# Patient Record
Sex: Female | Born: 1975 | Race: White | Hispanic: No | Marital: Married | Smoking: Current every day smoker
Health system: Southern US, Community
[De-identification: ages and names within clinical notes are randomized; demographics above are authoritative.]

## PROBLEM LIST (undated history)

## (undated) DIAGNOSIS — T1490XA Injury, unspecified, initial encounter: Secondary | ICD-10-CM

## (undated) DIAGNOSIS — M797 Fibromyalgia: Secondary | ICD-10-CM

## (undated) DIAGNOSIS — K219 Gastro-esophageal reflux disease without esophagitis: Secondary | ICD-10-CM

## (undated) DIAGNOSIS — Z8719 Personal history of other diseases of the digestive system: Secondary | ICD-10-CM

## (undated) DIAGNOSIS — R519 Headache, unspecified: Secondary | ICD-10-CM

## (undated) DIAGNOSIS — R87629 Unspecified abnormal cytological findings in specimens from vagina: Secondary | ICD-10-CM

## (undated) DIAGNOSIS — M1990 Unspecified osteoarthritis, unspecified site: Secondary | ICD-10-CM

## (undated) DIAGNOSIS — J42 Unspecified chronic bronchitis: Secondary | ICD-10-CM

## (undated) HISTORY — PX: KNEE SURGERY: SHX244

## (undated) HISTORY — DX: Injury, unspecified, initial encounter: T14.90XA

## (undated) HISTORY — DX: Unspecified osteoarthritis, unspecified site: M19.90

## (undated) HISTORY — PX: HIP SURGERY: SHX245

## (undated) HISTORY — PX: TONSILLECTOMY: SUR1361

## (undated) HISTORY — DX: Unspecified abnormal cytological findings in specimens from vagina: R87.629

---

## 2010-08-17 HISTORY — PX: CHOLECYSTECTOMY: SHX55

## 2015-08-18 HISTORY — PX: FRACTURE SURGERY: SHX138

## 2018-07-22 DIAGNOSIS — E119 Type 2 diabetes mellitus without complications: Secondary | ICD-10-CM | POA: Diagnosis not present

## 2018-07-22 DIAGNOSIS — E559 Vitamin D deficiency, unspecified: Secondary | ICD-10-CM | POA: Diagnosis not present

## 2018-07-22 DIAGNOSIS — I10 Essential (primary) hypertension: Secondary | ICD-10-CM | POA: Diagnosis not present

## 2018-07-22 DIAGNOSIS — E038 Other specified hypothyroidism: Secondary | ICD-10-CM | POA: Diagnosis not present

## 2018-07-22 DIAGNOSIS — Z5181 Encounter for therapeutic drug level monitoring: Secondary | ICD-10-CM | POA: Diagnosis not present

## 2018-07-22 DIAGNOSIS — M792 Neuralgia and neuritis, unspecified: Secondary | ICD-10-CM | POA: Diagnosis not present

## 2018-07-22 DIAGNOSIS — D518 Other vitamin B12 deficiency anemias: Secondary | ICD-10-CM | POA: Diagnosis not present

## 2018-07-22 DIAGNOSIS — E782 Mixed hyperlipidemia: Secondary | ICD-10-CM | POA: Diagnosis not present

## 2018-07-22 DIAGNOSIS — M797 Fibromyalgia: Secondary | ICD-10-CM | POA: Diagnosis not present

## 2018-07-22 DIAGNOSIS — K219 Gastro-esophageal reflux disease without esophagitis: Secondary | ICD-10-CM | POA: Diagnosis not present

## 2018-07-22 DIAGNOSIS — M150 Primary generalized (osteo)arthritis: Secondary | ICD-10-CM | POA: Diagnosis not present

## 2018-07-29 DIAGNOSIS — S8391XA Sprain of unspecified site of right knee, initial encounter: Secondary | ICD-10-CM | POA: Diagnosis not present

## 2018-07-29 DIAGNOSIS — M25561 Pain in right knee: Secondary | ICD-10-CM | POA: Diagnosis not present

## 2018-07-29 DIAGNOSIS — H6693 Otitis media, unspecified, bilateral: Secondary | ICD-10-CM | POA: Diagnosis not present

## 2018-08-18 ENCOUNTER — Other Ambulatory Visit: Payer: Self-pay | Admitting: Internal Medicine

## 2018-08-18 DIAGNOSIS — Z1231 Encounter for screening mammogram for malignant neoplasm of breast: Secondary | ICD-10-CM

## 2018-08-22 DIAGNOSIS — M797 Fibromyalgia: Secondary | ICD-10-CM | POA: Diagnosis not present

## 2018-08-22 DIAGNOSIS — Z79899 Other long term (current) drug therapy: Secondary | ICD-10-CM | POA: Diagnosis not present

## 2018-08-22 DIAGNOSIS — M792 Neuralgia and neuritis, unspecified: Secondary | ICD-10-CM | POA: Diagnosis not present

## 2018-08-22 DIAGNOSIS — M150 Primary generalized (osteo)arthritis: Secondary | ICD-10-CM | POA: Diagnosis not present

## 2018-08-22 DIAGNOSIS — F418 Other specified anxiety disorders: Secondary | ICD-10-CM | POA: Diagnosis not present

## 2018-08-22 DIAGNOSIS — G4700 Insomnia, unspecified: Secondary | ICD-10-CM | POA: Diagnosis not present

## 2018-08-22 DIAGNOSIS — Z5181 Encounter for therapeutic drug level monitoring: Secondary | ICD-10-CM | POA: Diagnosis not present

## 2018-08-22 DIAGNOSIS — F331 Major depressive disorder, recurrent, moderate: Secondary | ICD-10-CM | POA: Diagnosis not present

## 2018-08-22 DIAGNOSIS — K219 Gastro-esophageal reflux disease without esophagitis: Secondary | ICD-10-CM | POA: Diagnosis not present

## 2018-08-22 DIAGNOSIS — R252 Cramp and spasm: Secondary | ICD-10-CM | POA: Diagnosis not present

## 2018-09-16 ENCOUNTER — Other Ambulatory Visit: Payer: Self-pay

## 2018-09-16 ENCOUNTER — Telehealth: Payer: Self-pay | Admitting: General Practice

## 2018-09-16 ENCOUNTER — Ambulatory Visit: Payer: 59 | Admitting: Obstetrics and Gynecology

## 2018-09-16 ENCOUNTER — Ambulatory Visit
Admission: RE | Admit: 2018-09-16 | Discharge: 2018-09-16 | Disposition: A | Discharge status: Home / Self Care | Payer: PRIVATE HEALTH INSURANCE | Source: Ambulatory Visit | Attending: Internal Medicine | Admitting: Internal Medicine

## 2018-09-16 ENCOUNTER — Encounter: Payer: Self-pay | Admitting: Obstetrics and Gynecology

## 2018-09-16 VITALS — BP 143/83 | HR 76 | Temp 97.6°F | Ht 66.0 in | Wt 367.2 lb

## 2018-09-16 DIAGNOSIS — R300 Dysuria: Secondary | ICD-10-CM | POA: Diagnosis not present

## 2018-09-16 DIAGNOSIS — Z124 Encounter for screening for malignant neoplasm of cervix: Secondary | ICD-10-CM | POA: Diagnosis not present

## 2018-09-16 DIAGNOSIS — Z1151 Encounter for screening for human papillomavirus (HPV): Secondary | ICD-10-CM | POA: Diagnosis not present

## 2018-09-16 DIAGNOSIS — T8332XA Displacement of intrauterine contraceptive device, initial encounter: Secondary | ICD-10-CM

## 2018-09-16 DIAGNOSIS — Z1231 Encounter for screening mammogram for malignant neoplasm of breast: Secondary | ICD-10-CM | POA: Diagnosis not present

## 2018-09-16 DIAGNOSIS — Z01419 Encounter for gynecological examination (general) (routine) without abnormal findings: Secondary | ICD-10-CM | POA: Diagnosis not present

## 2018-09-16 LAB — POCT URINALYSIS DIPSTICK (MANUAL)
Leukocytes, UA: NEGATIVE
Nitrite, UA: NEGATIVE
Poct Bilirubin: NEGATIVE
Poct Blood: NEGATIVE
Poct Glucose: NORMAL mg/dL
Poct Ketones: NEGATIVE
Poct Protein: NEGATIVE mg/dL
Poct Urobilinogen: NORMAL mg/dL
Spec Grav, UA: 1.01 (ref 1.010–1.025)
pH, UA: 7 (ref 5.0–8.0)

## 2018-09-16 NOTE — Telephone Encounter (Signed)
Patient notified of [place] appt for 09/22/18 at 8:00am @ [facility]

## 2018-09-18 LAB — URINE CULTURE

## 2018-09-19 ENCOUNTER — Encounter: Payer: Self-pay | Admitting: Obstetrics and Gynecology
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
## 2018-09-21 ENCOUNTER — Ambulatory Visit (HOSPITAL_COMMUNITY): Payer: 59

## 2018-09-21 ENCOUNTER — Encounter (HOSPITAL_COMMUNITY): Payer: Self-pay

## 2018-09-21 DIAGNOSIS — F418 Other specified anxiety disorders: Secondary | ICD-10-CM | POA: Diagnosis not present

## 2018-09-21 DIAGNOSIS — K219 Gastro-esophageal reflux disease without esophagitis: Secondary | ICD-10-CM | POA: Diagnosis not present

## 2018-09-21 DIAGNOSIS — M797 Fibromyalgia: Secondary | ICD-10-CM | POA: Diagnosis not present

## 2018-09-21 DIAGNOSIS — Z5181 Encounter for therapeutic drug level monitoring: Secondary | ICD-10-CM | POA: Diagnosis not present

## 2018-09-21 DIAGNOSIS — M792 Neuralgia and neuritis, unspecified: Secondary | ICD-10-CM | POA: Diagnosis not present

## 2018-09-21 DIAGNOSIS — M150 Primary generalized (osteo)arthritis: Secondary | ICD-10-CM | POA: Diagnosis not present

## 2018-09-21 DIAGNOSIS — R5383 Other fatigue: Secondary | ICD-10-CM | POA: Diagnosis not present

## 2018-09-21 DIAGNOSIS — F331 Major depressive disorder, recurrent, moderate: Secondary | ICD-10-CM | POA: Diagnosis not present

## 2018-09-21 DIAGNOSIS — R079 Chest pain, unspecified: Secondary | ICD-10-CM | POA: Diagnosis not present

## 2018-09-21 LAB — CYTOLOGY - PAP
Diagnosis: NEGATIVE
HPV: NOT DETECTED

## 2018-09-22 ENCOUNTER — Ambulatory Visit (HOSPITAL_COMMUNITY): Payer: PRIVATE HEALTH INSURANCE

## 2018-09-29 ENCOUNTER — Ambulatory Visit (HOSPITAL_COMMUNITY)
Admission: RE | Admit: 2018-09-29 | Discharge: 2018-09-29 | Disposition: A | Discharge status: Home / Self Care | Payer: 59 | Source: Ambulatory Visit | Attending: Obstetrics and Gynecology | Admitting: Obstetrics and Gynecology

## 2018-09-29 ENCOUNTER — Encounter (INDEPENDENT_AMBULATORY_CARE_PROVIDER_SITE_OTHER): Payer: Self-pay

## 2018-09-29 DIAGNOSIS — T8332XA Displacement of intrauterine contraceptive device, initial encounter: Secondary | ICD-10-CM | POA: Diagnosis not present

## 2018-09-30 ENCOUNTER — Other Ambulatory Visit: Payer: Self-pay | Admitting: Obstetrics and Gynecology

## 2018-09-30 DIAGNOSIS — T8332XA Displacement of intrauterine contraceptive device, initial encounter: Secondary | ICD-10-CM

## 2018-09-30 NOTE — Progress Notes (Signed)
Transvaginal Pelvis U/S ordered

## 2018-10-03 ENCOUNTER — Telehealth: Payer: Self-pay | Admitting: General Practice

## 2018-10-03 NOTE — Telephone Encounter (Signed)
Left message for patient to give our office a call back in regards to U/S appointment scheduled for 10/12/18 at 11:00am.

## 2018-10-10 DIAGNOSIS — Z5181 Encounter for therapeutic drug level monitoring: Secondary | ICD-10-CM | POA: Diagnosis not present

## 2018-10-10 DIAGNOSIS — F331 Major depressive disorder, recurrent, moderate: Secondary | ICD-10-CM | POA: Diagnosis not present

## 2018-10-10 DIAGNOSIS — M792 Neuralgia and neuritis, unspecified: Secondary | ICD-10-CM | POA: Diagnosis not present

## 2018-10-10 DIAGNOSIS — F418 Other specified anxiety disorders: Secondary | ICD-10-CM | POA: Diagnosis not present

## 2018-10-10 DIAGNOSIS — G4700 Insomnia, unspecified: Secondary | ICD-10-CM | POA: Diagnosis not present

## 2018-10-10 DIAGNOSIS — K219 Gastro-esophageal reflux disease without esophagitis: Secondary | ICD-10-CM | POA: Diagnosis not present

## 2018-10-10 DIAGNOSIS — M797 Fibromyalgia: Secondary | ICD-10-CM | POA: Diagnosis not present

## 2018-10-10 DIAGNOSIS — M150 Primary generalized (osteo)arthritis: Secondary | ICD-10-CM | POA: Diagnosis not present

## 2018-10-10 DIAGNOSIS — Z79899 Other long term (current) drug therapy: Secondary | ICD-10-CM | POA: Diagnosis not present

## 2018-10-11 ENCOUNTER — Ambulatory Visit (INDEPENDENT_AMBULATORY_CARE_PROVIDER_SITE_OTHER): Payer: 59 | Admitting: Family Medicine

## 2018-10-11 ENCOUNTER — Encounter (INDEPENDENT_AMBULATORY_CARE_PROVIDER_SITE_OTHER): Payer: Self-pay

## 2018-10-12 ENCOUNTER — Ambulatory Visit (HOSPITAL_COMMUNITY): Payer: 59

## 2018-10-13 ENCOUNTER — Ambulatory Visit (HOSPITAL_COMMUNITY): Admission: RE | Admit: 2018-10-13 | Payer: 59 | Source: Ambulatory Visit

## 2018-10-25 ENCOUNTER — Ambulatory Visit (INDEPENDENT_AMBULATORY_CARE_PROVIDER_SITE_OTHER): Payer: Self-pay | Admitting: Family Medicine

## 2018-10-31 DIAGNOSIS — E669 Obesity, unspecified: Secondary | ICD-10-CM | POA: Diagnosis not present

## 2018-11-07 DIAGNOSIS — R6883 Chills (without fever): Secondary | ICD-10-CM | POA: Diagnosis not present

## 2018-11-07 DIAGNOSIS — R05 Cough: Secondary | ICD-10-CM | POA: Diagnosis not present

## 2018-11-07 DIAGNOSIS — J069 Acute upper respiratory infection, unspecified: Secondary | ICD-10-CM | POA: Diagnosis not present

## 2018-11-10 ENCOUNTER — Encounter (INDEPENDENT_AMBULATORY_CARE_PROVIDER_SITE_OTHER): Payer: Self-pay

## 2018-11-14 DIAGNOSIS — E669 Obesity, unspecified: Secondary | ICD-10-CM | POA: Diagnosis not present

## 2018-11-16 DIAGNOSIS — S8391XA Sprain of unspecified site of right knee, initial encounter: Secondary | ICD-10-CM | POA: Diagnosis not present

## 2018-11-16 DIAGNOSIS — Z5181 Encounter for therapeutic drug level monitoring: Secondary | ICD-10-CM | POA: Diagnosis not present

## 2018-11-16 DIAGNOSIS — F418 Other specified anxiety disorders: Secondary | ICD-10-CM | POA: Diagnosis not present

## 2018-12-22 DIAGNOSIS — K219 Gastro-esophageal reflux disease without esophagitis: Secondary | ICD-10-CM | POA: Diagnosis not present

## 2018-12-22 DIAGNOSIS — F418 Other specified anxiety disorders: Secondary | ICD-10-CM | POA: Diagnosis not present

## 2018-12-22 DIAGNOSIS — Z5181 Encounter for therapeutic drug level monitoring: Secondary | ICD-10-CM | POA: Diagnosis not present

## 2018-12-22 DIAGNOSIS — M150 Primary generalized (osteo)arthritis: Secondary | ICD-10-CM | POA: Diagnosis not present

## 2018-12-22 DIAGNOSIS — Z79899 Other long term (current) drug therapy: Secondary | ICD-10-CM | POA: Diagnosis not present

## 2018-12-22 DIAGNOSIS — M797 Fibromyalgia: Secondary | ICD-10-CM | POA: Diagnosis not present

## 2018-12-22 DIAGNOSIS — F331 Major depressive disorder, recurrent, moderate: Secondary | ICD-10-CM | POA: Diagnosis not present

## 2018-12-22 DIAGNOSIS — M1711 Unilateral primary osteoarthritis, right knee: Secondary | ICD-10-CM | POA: Diagnosis not present

## 2018-12-22 DIAGNOSIS — M792 Neuralgia and neuritis, unspecified: Secondary | ICD-10-CM | POA: Diagnosis not present

## 2018-12-27 DIAGNOSIS — B373 Candidiasis of vulva and vagina: Secondary | ICD-10-CM | POA: Diagnosis not present

## 2019-01-10 DIAGNOSIS — Z79899 Other long term (current) drug therapy: Secondary | ICD-10-CM | POA: Diagnosis not present

## 2019-01-20 DIAGNOSIS — F331 Major depressive disorder, recurrent, moderate: Secondary | ICD-10-CM | POA: Diagnosis not present

## 2019-01-20 DIAGNOSIS — G4700 Insomnia, unspecified: Secondary | ICD-10-CM | POA: Diagnosis not present

## 2019-01-20 DIAGNOSIS — Z79899 Other long term (current) drug therapy: Secondary | ICD-10-CM | POA: Diagnosis not present

## 2019-01-20 DIAGNOSIS — K219 Gastro-esophageal reflux disease without esophagitis: Secondary | ICD-10-CM | POA: Diagnosis not present

## 2019-01-20 DIAGNOSIS — M792 Neuralgia and neuritis, unspecified: Secondary | ICD-10-CM | POA: Diagnosis not present

## 2019-01-20 DIAGNOSIS — M150 Primary generalized (osteo)arthritis: Secondary | ICD-10-CM | POA: Diagnosis not present

## 2019-01-20 DIAGNOSIS — M797 Fibromyalgia: Secondary | ICD-10-CM | POA: Diagnosis not present

## 2019-01-20 DIAGNOSIS — F418 Other specified anxiety disorders: Secondary | ICD-10-CM | POA: Diagnosis not present

## 2019-01-20 DIAGNOSIS — Z5181 Encounter for therapeutic drug level monitoring: Secondary | ICD-10-CM | POA: Diagnosis not present

## 2019-02-16 DIAGNOSIS — G43909 Migraine, unspecified, not intractable, without status migrainosus: Secondary | ICD-10-CM | POA: Diagnosis not present

## 2019-02-16 DIAGNOSIS — S8391XA Sprain of unspecified site of right knee, initial encounter: Secondary | ICD-10-CM | POA: Diagnosis not present

## 2019-02-16 DIAGNOSIS — R252 Cramp and spasm: Secondary | ICD-10-CM | POA: Diagnosis not present

## 2019-02-16 DIAGNOSIS — F418 Other specified anxiety disorders: Secondary | ICD-10-CM | POA: Diagnosis not present

## 2019-02-16 DIAGNOSIS — M797 Fibromyalgia: Secondary | ICD-10-CM | POA: Diagnosis not present

## 2019-02-16 DIAGNOSIS — Z5181 Encounter for therapeutic drug level monitoring: Secondary | ICD-10-CM | POA: Diagnosis not present

## 2019-02-16 DIAGNOSIS — Z79899 Other long term (current) drug therapy: Secondary | ICD-10-CM | POA: Diagnosis not present

## 2019-03-09 DIAGNOSIS — E669 Obesity, unspecified: Secondary | ICD-10-CM | POA: Diagnosis not present

## 2019-03-16 DIAGNOSIS — R110 Nausea: Secondary | ICD-10-CM | POA: Diagnosis not present

## 2019-03-16 DIAGNOSIS — R252 Cramp and spasm: Secondary | ICD-10-CM | POA: Diagnosis not present

## 2019-03-16 DIAGNOSIS — F418 Other specified anxiety disorders: Secondary | ICD-10-CM | POA: Diagnosis not present

## 2019-03-16 DIAGNOSIS — Z5181 Encounter for therapeutic drug level monitoring: Secondary | ICD-10-CM | POA: Diagnosis not present

## 2019-03-16 DIAGNOSIS — Z30014 Encounter for initial prescription of intrauterine contraceptive device: Secondary | ICD-10-CM | POA: Diagnosis not present

## 2019-03-16 DIAGNOSIS — H6691 Otitis media, unspecified, right ear: Secondary | ICD-10-CM | POA: Diagnosis not present

## 2019-03-16 DIAGNOSIS — S8391XA Sprain of unspecified site of right knee, initial encounter: Secondary | ICD-10-CM | POA: Diagnosis not present

## 2019-03-21 DIAGNOSIS — E669 Obesity, unspecified: Secondary | ICD-10-CM | POA: Diagnosis not present

## 2019-04-05 DIAGNOSIS — E669 Obesity, unspecified: Secondary | ICD-10-CM | POA: Diagnosis not present

## 2019-04-12 DIAGNOSIS — R350 Frequency of micturition: Secondary | ICD-10-CM | POA: Diagnosis not present

## 2019-04-12 DIAGNOSIS — N390 Urinary tract infection, site not specified: Secondary | ICD-10-CM | POA: Diagnosis not present

## 2019-04-25 ENCOUNTER — Ambulatory Visit: Payer: 59 | Admitting: Orthopedic Surgery

## 2019-05-08 ENCOUNTER — Ambulatory Visit (INDEPENDENT_AMBULATORY_CARE_PROVIDER_SITE_OTHER): Payer: 59

## 2019-05-08 ENCOUNTER — Ambulatory Visit (INDEPENDENT_AMBULATORY_CARE_PROVIDER_SITE_OTHER): Payer: 59 | Admitting: Orthopedic Surgery

## 2019-05-08 ENCOUNTER — Encounter: Payer: Self-pay | Admitting: Orthopedic Surgery

## 2019-05-08 VITALS — Ht 66.0 in | Wt 367.0 lb

## 2019-05-08 DIAGNOSIS — G8929 Other chronic pain: Secondary | ICD-10-CM

## 2019-05-08 DIAGNOSIS — M25561 Pain in right knee: Secondary | ICD-10-CM | POA: Diagnosis not present

## 2019-05-08 DIAGNOSIS — Z6841 Body Mass Index (BMI) 40.0 and over, adult: Secondary | ICD-10-CM

## 2019-05-08 DIAGNOSIS — M1711 Unilateral primary osteoarthritis, right knee: Secondary | ICD-10-CM
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
## 2019-05-12 ENCOUNTER — Other Ambulatory Visit: Payer: Self-pay

## 2019-05-12 DIAGNOSIS — M150 Primary generalized (osteo)arthritis: Secondary | ICD-10-CM | POA: Diagnosis not present

## 2019-05-12 DIAGNOSIS — G4700 Insomnia, unspecified: Secondary | ICD-10-CM | POA: Diagnosis not present

## 2019-05-12 DIAGNOSIS — F418 Other specified anxiety disorders: Secondary | ICD-10-CM | POA: Diagnosis not present

## 2019-05-12 DIAGNOSIS — Z79899 Other long term (current) drug therapy: Secondary | ICD-10-CM | POA: Diagnosis not present

## 2019-05-12 DIAGNOSIS — Z5181 Encounter for therapeutic drug level monitoring: Secondary | ICD-10-CM | POA: Diagnosis not present

## 2019-05-12 DIAGNOSIS — K219 Gastro-esophageal reflux disease without esophagitis: Secondary | ICD-10-CM | POA: Diagnosis not present

## 2019-05-12 DIAGNOSIS — M792 Neuralgia and neuritis, unspecified: Secondary | ICD-10-CM | POA: Diagnosis not present

## 2019-05-12 DIAGNOSIS — F331 Major depressive disorder, recurrent, moderate: Secondary | ICD-10-CM | POA: Diagnosis not present

## 2019-05-12 DIAGNOSIS — M797 Fibromyalgia: Secondary | ICD-10-CM | POA: Diagnosis not present

## 2019-06-02 ENCOUNTER — Other Ambulatory Visit: Payer: Self-pay | Admitting: Family

## 2019-06-05 ENCOUNTER — Inpatient Hospital Stay (HOSPITAL_COMMUNITY)
Admission: RE | Admit: 2019-06-05 | Discharge: 2019-06-05 | Disposition: A | Discharge status: Home / Self Care | Payer: 59 | Source: Ambulatory Visit

## 2019-06-05 ENCOUNTER — Inpatient Hospital Stay (HOSPITAL_COMMUNITY): Admission: RE | Admit: 2019-06-05 | Payer: 59 | Source: Ambulatory Visit

## 2019-06-05 NOTE — Progress Notes (Signed)
Pt no show for PAT appt, called and pt stated she cancelled her surgery.  Instructed her to call surgeon's office, stated she already had cancelled with the office

## 2019-06-05 NOTE — Progress Notes (Signed)
[place], [place] - [address] [facility]. [address] [place] [address] Phone: [phone] Fax: [phone]      Your procedure is scheduled on June 07, 2019.  Report to [facility] Main Entrance "A" at 06:30 A.M., and check in at the Admitting office.  Call this number if you have problems the morning of surgery:  [phone]  Call [phone] if you have any questions prior to your surgery date Monday-Friday 8am-4pm    Remember:  Do not eat after midnight the night before your surgery  You may drink clear liquids until 05:30 the morning of your surgery.   Clear liquids allowed are: Water, Non-Citrus Juices (without pulp), Carbonated Beverages, Clear Tea, Black Coffee Only, and Gatorade   Enhanced Recovery after Surgery for Orthopedics Enhanced Recovery after Surgery is a protocol used to improve the stress on your body and your recovery after surgery.  Patient Instructions  . The night before surgery:  o No food after midnight. ONLY clear liquids after midnight  .  Marland Kitchen The day of surgery (if you do NOT have diabetes):  o Drink ONE (1) Pre-Surgery Clear Ensure as directed.   o This drink was given to you during your hospital  pre-op appointment visit. o The pre-op nurse will instruct you on the time to drink the  Pre-Surgery Ensure depending on your surgery time. Finish drink by 5:30AM o Finish the drink at the designated time by the pre-op nurse.  o Nothing else to drink after completing the  Pre-Surgery Clear Ensure.     Take these medicines the morning of surgery with A SIP OF WATER : None  7 days prior to surgery STOP taking any Aspirin (unless otherwise instructed by your surgeon), Aleve, Naproxen, Ibuprofen, Motrin, Advil, Goody's, BC's, all herbal medications, fish oil, and all vitamins.    The Morning of Surgery  Do not wear jewelry, make-up or nail polish.  Do not wear lotions, powders, or  perfumes/colognes, or deodorant  Do not shave 48 hours prior to surgery.   Do not bring valuables to the hospital.  [facility] is not responsible for any belongings or valuables.  If you are a smoker, DO NOT Smoke 24 hours prior to surgery IF you wear a CPAP at night please bring your mask, tubing, and machine the morning of surgery   Remember that you must have someone to transport you home after your surgery, and remain with you for 24 hours if you are discharged the same day.   Contacts, glasses, hearing aids, dentures or bridgework may not be worn into surgery.    Leave your suitcase in the car.  After surgery it may be brought to your room.  For patients admitted to the hospital, discharge time will be determined by your treatment team.  Patients discharged the day of surgery will not be allowed to drive home.    Special instructions:   [facility]- Preparing For Surgery  Before surgery, you can play an important role. Because skin is not sterile, your skin needs to be as free of germs as possible. You can reduce the number of germs on your skin by washing with CHG (chlorahexidine gluconate) Soap before surgery.  CHG is an antiseptic cleaner which kills germs and bonds with the skin to continue killing germs even after washing.    Oral Hygiene is also important to reduce your risk of infection.  Remember - BRUSH YOUR TEETH THE MORNING OF  SURGERY WITH YOUR REGULAR TOOTHPASTE  Please do not use if you have an allergy to CHG or antibacterial soaps. If your skin becomes reddened/irritated stop using the CHG.  Do not shave (including legs and underarms) for at least 48 hours prior to first CHG shower. It is OK to shave your face.  Please follow these instructions carefully.   1. Shower the NIGHT BEFORE SURGERY and the MORNING OF SURGERY with CHG Soap.   2. If you chose to wash your hair, wash your hair first as usual with your normal shampoo.  3. After you shampoo, rinse your  hair and body thoroughly to remove the shampoo.  4. Use CHG as you would any other liquid soap. You can apply CHG directly to the skin and wash gently with a scrungie or a clean washcloth.   5. Apply the CHG Soap to your body ONLY FROM THE NECK DOWN.  Do not use on open wounds or open sores. Avoid contact with your eyes, ears, mouth and genitals (private parts). Wash Face and genitals (private parts)  with your normal soap.   6. Wash thoroughly, paying special attention to the area where your surgery will be performed.  7. Thoroughly rinse your body with warm water from the neck down.  8. DO NOT shower/wash with your normal soap after using and rinsing off the CHG Soap.  9. Pat yourself dry with a CLEAN TOWEL.  10. Wear CLEAN PAJAMAS to bed the night before surgery, wear comfortable clothes the morning of surgery  11. Place CLEAN SHEETS on your bed the night of your first shower and DO NOT SLEEP WITH PETS.    Day of Surgery:  Do not apply any deodorants/lotions. Please shower the morning of surgery with the CHG soap  Please wear clean clothes to the hospital/surgery center.   Remember to brush your teeth WITH YOUR REGULAR TOOTHPASTE.   Please read over the following fact sheets that you were given.

## 2019-06-07 ENCOUNTER — Ambulatory Visit (HOSPITAL_COMMUNITY): Admission: RE | Admit: 2019-06-07 | Payer: 59 | Source: Home / Self Care | Admitting: Orthopedic Surgery

## 2019-06-07 ENCOUNTER — Encounter (HOSPITAL_COMMUNITY): Admission: RE | Payer: Self-pay | Source: Home / Self Care

## 2019-06-07 SURGERY — ARTHROPLASTY, KNEE, TOTAL
Anesthesia: General | Site: Knee | Laterality: Right

## 2019-08-30 ENCOUNTER — Encounter: Payer: Self-pay | Admitting: General Practice

## 2020-10-20 IMAGING — US US PELVIS COMPLETE
1 series · 15 of 19 positions shown · non-contrast
Comparison: CT on 01/16/2018

CLINICAL DATA: Lost IUD strings.

EXAM:
TRANSABDOMINAL ULTRASOUND OF PELVIS
TECHNIQUE: Transabdominal ultrasound examination of the pelvis was performed
including evaluation of the uterus, ovaries, adnexal regions, and
pelvic cul-de-sac. Transvaginal sonography was not ordered by the
provider.

[Series 1: us pelvis complete · 15 of 19 slices shown]
[im 1/19]
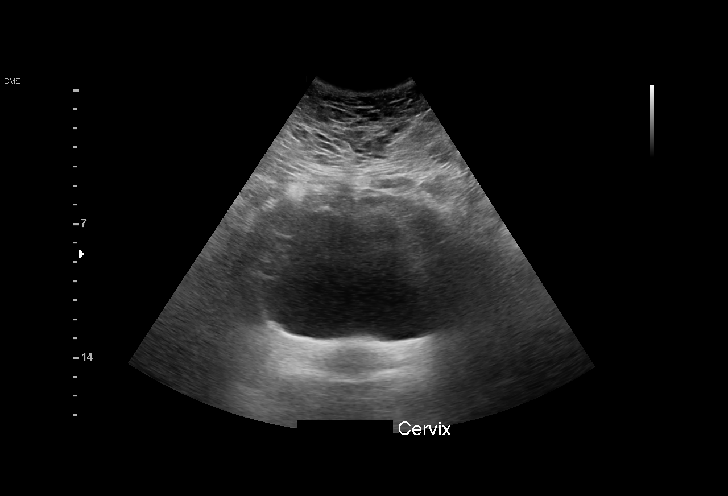
[im 2/19]
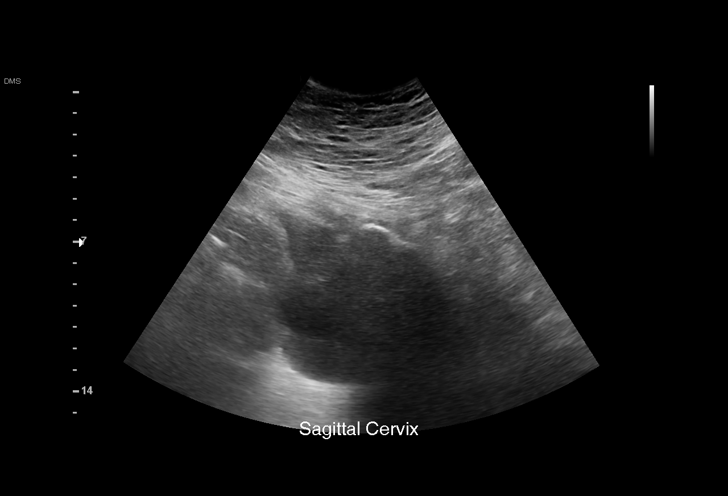
[im 4/19]
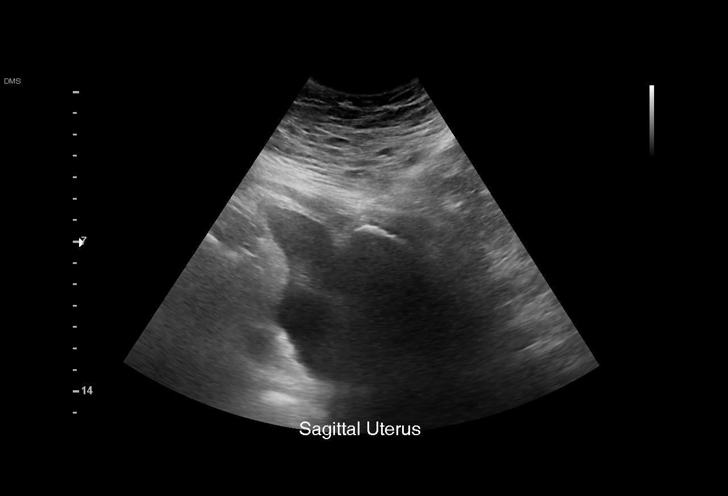
[im 5/19]
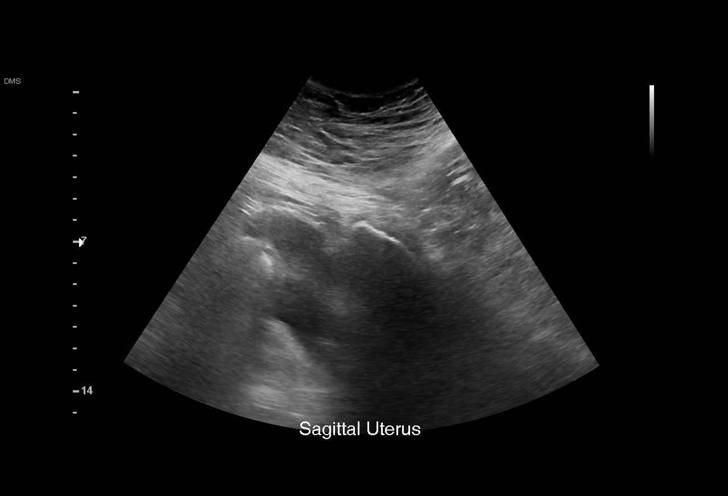
[im 6/19]
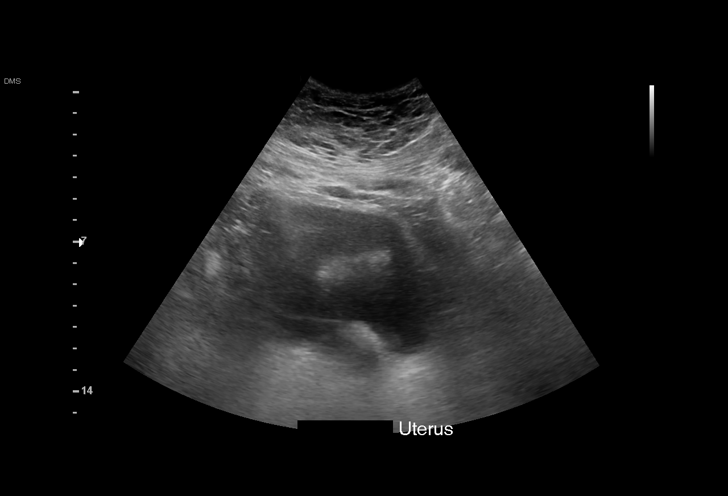
[im 7/19]
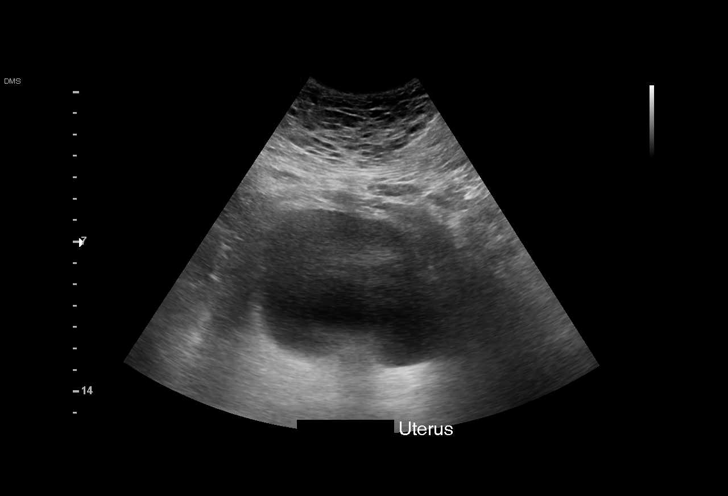
[im 9/19]
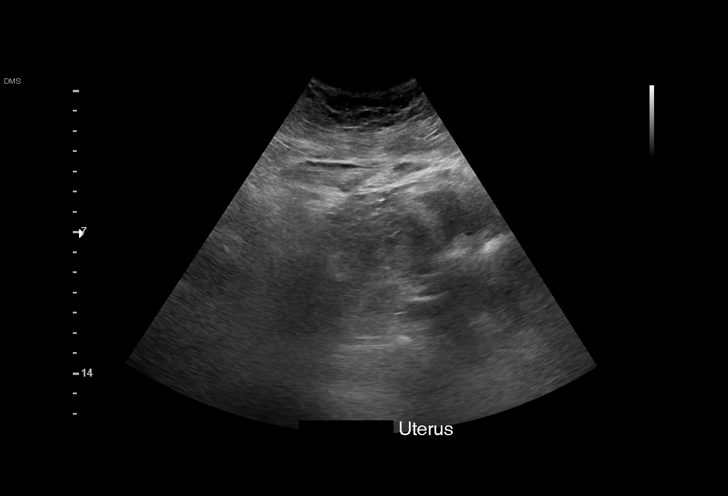
[im 10/19]
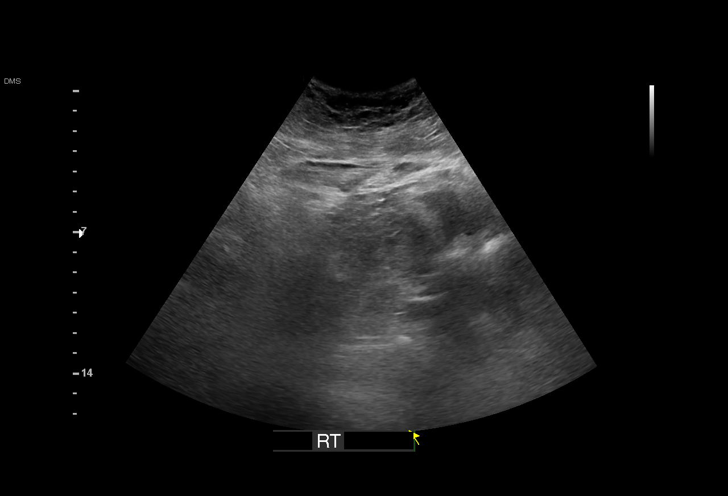
[im 11/19]
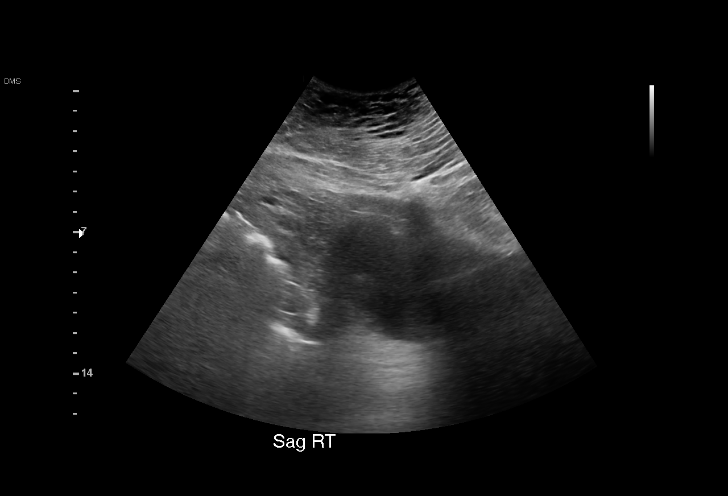
[im 13/19]
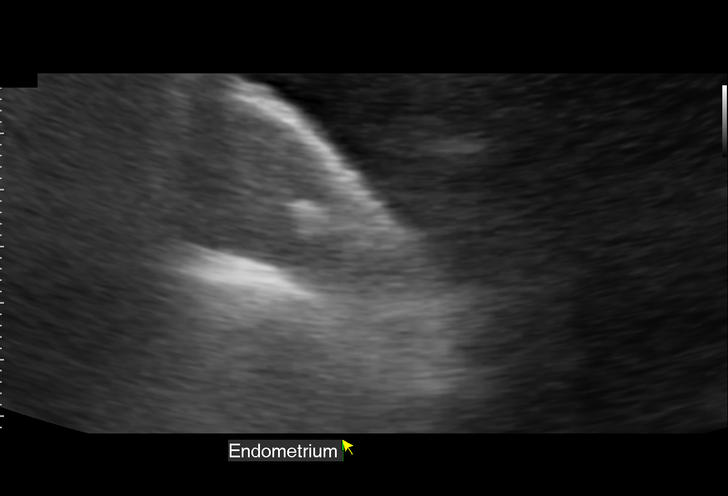
[im 14/19]
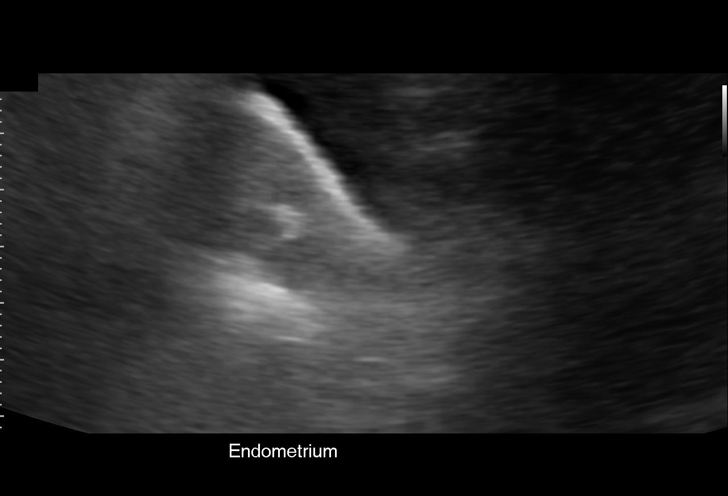
[im 15/19]
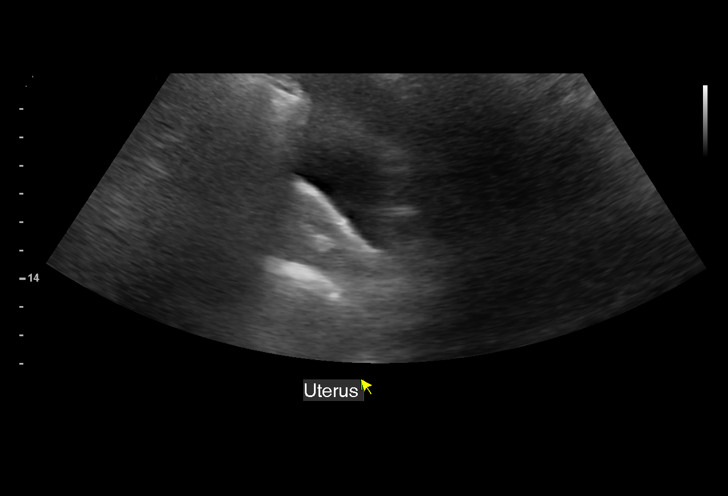
[im 16/19]
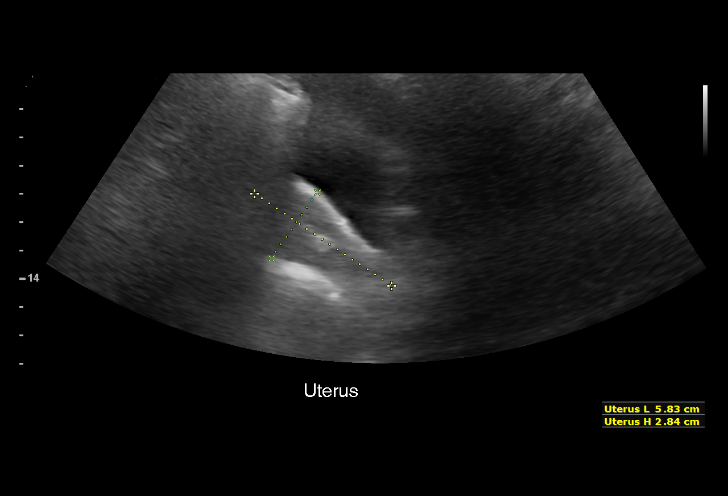
[im 18/19]
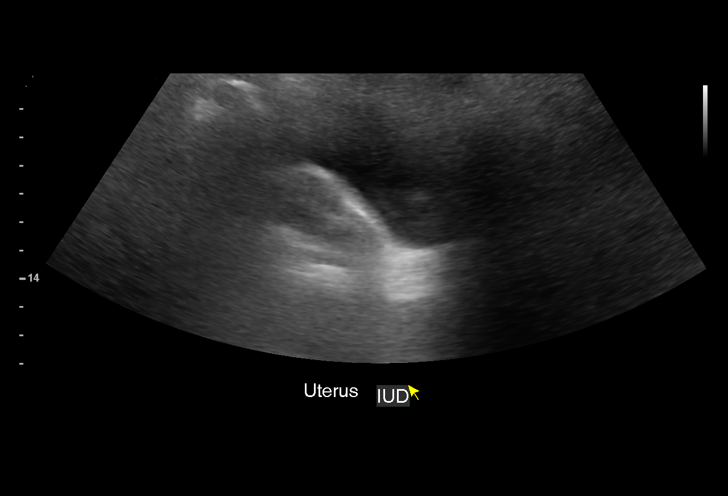
[im 19/19]
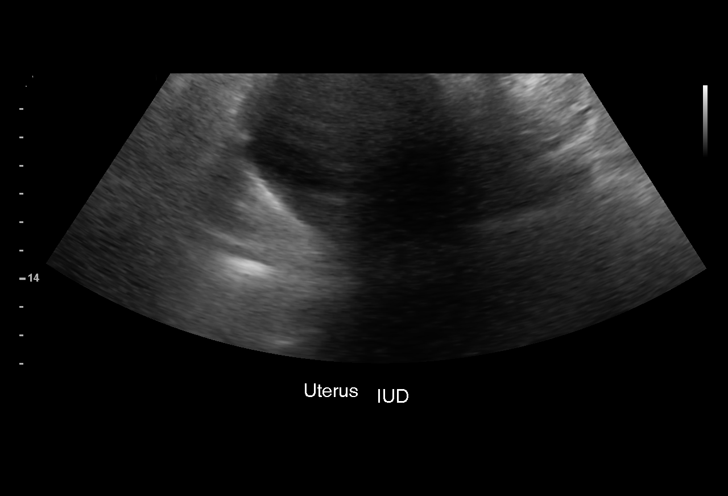

[15 of 19 positions shown; findings below may reference images not displayed]

FINDINGS: Transabdominal sonography is limited by large patient habitus.

Uterus

Limited visualization. Measurements: 5.8 x 2.9 x 4.0 cm = volume: 35
mL. No large fibroids identified. Small fibroids cannot be excluded
on this exam.

Endometrium

Thickness: Not visualized by transabdominal sonography. Location of
IUD could not be determined.

Right ovary

Measurements: Not visualized, however no adnexal mass identified.

Left ovary

Measurements: Not visualized, however no adnexal mass identified.

Other findings:  No abnormal free fluid.
IMPRESSION: Transabdominal exam is significantly limited by large patient
habitus. IUD location could not be determined by this exam. Consider
further evaluation with transvaginal pelvic ultrasound, or pelvis CT
without contrast.

## 2020-11-04 ENCOUNTER — Encounter: Payer: Self-pay | Admitting: Orthopedic Surgery

## 2020-11-04 ENCOUNTER — Ambulatory Visit: Payer: Self-pay

## 2020-11-04 ENCOUNTER — Ambulatory Visit (INDEPENDENT_AMBULATORY_CARE_PROVIDER_SITE_OTHER): Payer: 59 | Admitting: Orthopedic Surgery

## 2020-11-04 DIAGNOSIS — M545 Low back pain, unspecified: Secondary | ICD-10-CM

## 2020-11-04 DIAGNOSIS — M25551 Pain in right hip: Secondary | ICD-10-CM | POA: Diagnosis not present

## 2020-11-04 DIAGNOSIS — Z6841 Body Mass Index (BMI) 40.0 and over, adult: Secondary | ICD-10-CM

## 2020-11-04 DIAGNOSIS — M5136 Other intervertebral disc degeneration, lumbar region: Secondary | ICD-10-CM

## 2020-11-04 DIAGNOSIS — M1711 Unilateral primary osteoarthritis, right knee: Secondary | ICD-10-CM | POA: Diagnosis not present

## 2020-11-04 DIAGNOSIS — G8929 Other chronic pain: Secondary | ICD-10-CM
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
## 2020-12-03 ENCOUNTER — Other Ambulatory Visit: Payer: Self-pay | Admitting: Physician Assistant

## 2020-12-03 ENCOUNTER — Other Ambulatory Visit: Payer: Self-pay

## 2020-12-09 ENCOUNTER — Encounter (HOSPITAL_COMMUNITY): Payer: Self-pay

## 2020-12-09 NOTE — Pre-Procedure Instructions (Signed)
Surgical Instructions:    Your procedure is scheduled on Friday, 12/13/20 (07:30 AM- 09:13 AM).  Report to [facility] Main Entrance "A" at 05:30 A.M., then check in with the Admitting office.  Call this number if you have any questions prior to, or have any problems the morning of surgery:  [phone]    Remember:  Do not eat after midnight the night before your surgery.  You may drink clear liquids until 04:30 AM the morning of your surgery.   Clear liquids allowed are: Water, Non-Citrus Juices (without pulp), Carbonated Beverages, Clear Tea, Black Coffee Only, and Gatorade.   Enhanced Recovery after Surgery for Orthopedics Enhanced Recovery after Surgery is a protocol used to improve the stress on your body and your recovery after surgery.  Patient Instructions  . The night before surgery:  o No food after midnight. ONLY clear liquids after midnight   . The day of surgery (if you do NOT have diabetes):  o Drink ONE (1) Pre-Surgery Clear Ensure by 04:30 AM the morning of surgery.   o This drink was given to you during your hospital pre-op appointment visit. o Nothing else to drink after completing the Pre-Surgery Clear Ensure.         If you have questions, please contact your surgeon's office.      Take these medicines the morning of surgery with A SIP OF WATER: omeprazole (PRILOSEC)   IF NEEDED: albuterol (VENTOLIN HFA) inhaler- Please bring all inhalers with you the day of surgery   As of today, STOP taking any Aspirin (unless otherwise instructed by your surgeon) Aleve, Naproxen, Ibuprofen, Motrin, Advil, Goody's, BC's, all herbal medications, fish oil, and all vitamins.             Special instructions:   [place]- Preparing For Surgery  Before surgery, you can play an important role. Because skin is not sterile, your skin needs to be as free of germs as possible. You can reduce the number of germs on your skin by washing with CHG (chlorahexidine gluconate)  Soap before surgery.  CHG is an antiseptic cleaner which kills germs and bonds with the skin to continue killing germs even after washing.    Oral Hygiene is also important to reduce your risk of infection.  Remember - BRUSH YOUR TEETH THE MORNING OF SURGERY WITH YOUR REGULAR TOOTHPASTE  Please do not use if you have an allergy to CHG or antibacterial soaps. If your skin becomes reddened/irritated stop using the CHG.  Do not shave (including legs and underarms) for at least 48 hours prior to first CHG shower. It is OK to shave your face.  Please follow these instructions carefully.   1. Shower the NIGHT BEFORE SURGERY and the MORNING OF SURGERY  2. If you chose to wash your hair, wash your hair first as usual with your normal shampoo.  3. After you shampoo, rinse your hair and body thoroughly to remove the shampoo.  4. Wash Face and genitals (private parts) with your normal soap.   5. Use CHG Soap as you would any other liquid soap. You can apply CHG directly to the skin and wash gently with a scrungie or a clean washcloth.   6. Apply the CHG Soap to your body ONLY FROM THE NECK DOWN.  Do not use on open wounds or open sores. Avoid contact with your eyes, ears, mouth and genitals (private parts). Wash Face and genitals (private parts)  with your normal soap.   7.  Wash thoroughly, paying special attention to the area where your surgery will be performed.  8. Thoroughly rinse your body with warm water from the neck down.  9. DO NOT shower/wash with your normal soap after using and rinsing off the CHG Soap.  10. Pat yourself dry with a CLEAN TOWEL.  11. Wear CLEAN PAJAMAS to bed the night before surgery.  12. Place CLEAN SHEETS on your bed the night before your surgery.  13. DO NOT SLEEP WITH PETS.   Day of Surgery: SHOWER with CHG soap. Brush your teeth WITH YOUR REGULAR TOOTHPASTE. Wear Clean/Comfortable clothing the morning of surgery. Do not apply any deodorants/lotions.    Do not wear jewelry, make up, or nail polish. Do not shave 48 hours prior to surgery.    Do NOT Smoke (Tobacco/Vaping) or drink Alcohol 24 hours prior to your procedure. Do not bring valuables to the hospital. [facility] is not responsible for any belongings or valuables.  If you use a CPAP at night, you may bring all equipment for your overnight stay.   Contacts, glasses, or dentures may not be worn into surgery, please bring cases for these belongings.   For patients admitted to the hospital, discharge time will be determined by your treatment team.   Patients discharged the day of surgery will not be allowed to drive home, and someone needs to stay with them for 24 hours.    Please read over the following fact sheets that you were given.

## 2020-12-10 ENCOUNTER — Other Ambulatory Visit: Payer: Self-pay

## 2020-12-10 ENCOUNTER — Encounter (HOSPITAL_COMMUNITY): Payer: Self-pay

## 2020-12-10 ENCOUNTER — Encounter (HOSPITAL_COMMUNITY)
Admission: RE | Admit: 2020-12-10 | Discharge: 2020-12-10 | Disposition: A | Discharge status: Home / Self Care | Payer: 59 | Source: Ambulatory Visit | Attending: Orthopedic Surgery | Admitting: Orthopedic Surgery

## 2020-12-10 DIAGNOSIS — Z20822 Contact with and (suspected) exposure to covid-19: Secondary | ICD-10-CM | POA: Diagnosis not present

## 2020-12-10 DIAGNOSIS — Z01812 Encounter for preprocedural laboratory examination: Secondary | ICD-10-CM | POA: Insufficient documentation

## 2020-12-10 HISTORY — DX: Gastro-esophageal reflux disease without esophagitis: K21.9

## 2020-12-10 HISTORY — DX: Fibromyalgia: M79.7

## 2020-12-10 HISTORY — DX: Unspecified chronic bronchitis: J42

## 2020-12-10 HISTORY — DX: Personal history of other diseases of the digestive system: Z87.19

## 2020-12-10 HISTORY — DX: Headache, unspecified: R51.9

## 2020-12-10 LAB — SURGICAL PCR SCREEN
MRSA, PCR: NEGATIVE
Staphylococcus aureus: NEGATIVE

## 2020-12-10 LAB — CBC
HCT: 45.7 % (ref 36.0–46.0)
Hemoglobin: 14.7 g/dL (ref 12.0–15.0)
MCH: 29.8 pg (ref 26.0–34.0)
MCHC: 32.2 g/dL (ref 30.0–36.0)
MCV: 92.5 fL (ref 80.0–100.0)
Platelets: 296 10*3/uL (ref 150–400)
RBC: 4.94 MIL/uL (ref 3.87–5.11)
RDW: 15.5 % (ref 11.5–15.5)
WBC: 10.2 10*3/uL (ref 4.0–10.5)
nRBC: 0 % (ref 0.0–0.2)

## 2020-12-10 LAB — BASIC METABOLIC PANEL
Anion gap: 9 (ref 5–15)
BUN: 14 mg/dL (ref 6–20)
CO2: 24 mmol/L (ref 22–32)
Calcium: 9.1 mg/dL (ref 8.9–10.3)
Chloride: 104 mmol/L (ref 98–111)
Creatinine, Ser: 0.72 mg/dL (ref 0.44–1.00)
GFR, Estimated: >60 mL/min (ref >60)
Glucose, Bld: 77 mg/dL (ref 70–99)
Potassium: 4.2 mmol/L (ref 3.5–5.1)
Sodium: 137 mmol/L (ref 135–145)

## 2020-12-10 LAB — SARS CORONAVIRUS 2 (TAT 6-24 HRS): SARS Coronavirus 2: NEGATIVE

## 2020-12-10 NOTE — Progress Notes (Signed)
PCP - Donnel Saxon, MD Cardiologist - Denies  PPM/ICD - Denies  Chest x-ray - N/A EKG - N/A Stress Test - Per pt, 2012 at [facility], results were negative. ECHO - Denies Cardiac Cath - Denies  Sleep Study - Denies  Patient denies being diabetic.  Blood Thinner Instructions: N/A Aspirin Instructions: Per pt, last dose 11/29/20.  ERAS Protcol -Yes PRE-SURGERY Ensure or G2- Ensure given  COVID TEST- 12/10/20; results pending.   Anesthesia review: No  Patient denies shortness of breath, fever, cough and chest pain at PAT appointment   All instructions explained to the patient, with a verbal understanding of the material. Patient agrees to go over the instructions while at home for a better understanding. Patient also instructed to self quarantine after being tested for COVID-19. The opportunity to ask questions was provided.

## 2020-12-13 ENCOUNTER — Encounter: Payer: Self-pay | Admitting: Orthopedic Surgery

## 2020-12-13 ENCOUNTER — Encounter (HOSPITAL_COMMUNITY)
Admission: RE | Disposition: A | Discharge status: Home / Self Care | Payer: Self-pay | Source: Home / Self Care | Attending: Orthopedic Surgery

## 2020-12-13 ENCOUNTER — Observation Stay (HOSPITAL_COMMUNITY)
Admission: RE | Admit: 2020-12-13 | Discharge: 2020-12-14 | Disposition: A | Discharge status: Home / Self Care | Payer: 59 | Attending: Orthopedic Surgery | Admitting: Orthopedic Surgery

## 2020-12-13 ENCOUNTER — Other Ambulatory Visit: Payer: Self-pay

## 2020-12-13 ENCOUNTER — Ambulatory Visit (HOSPITAL_COMMUNITY): Payer: 59 | Admitting: Anesthesiology

## 2020-12-13 ENCOUNTER — Encounter (HOSPITAL_COMMUNITY): Payer: Self-pay | Admitting: Orthopedic Surgery

## 2020-12-13 DIAGNOSIS — F17210 Nicotine dependence, cigarettes, uncomplicated: Secondary | ICD-10-CM | POA: Insufficient documentation

## 2020-12-13 DIAGNOSIS — M1711 Unilateral primary osteoarthritis, right knee: Secondary | ICD-10-CM | POA: Diagnosis present

## 2020-12-13 HISTORY — PX: TOTAL KNEE ARTHROPLASTY: SHX125

## 2020-12-13 LAB — POCT PREGNANCY, URINE: Preg Test, Ur: NEGATIVE

## 2020-12-13 SURGERY — ARTHROPLASTY, KNEE, TOTAL
Anesthesia: General | Site: Knee | Laterality: Right

## 2020-12-13 MED ORDER — DIPHENHYDRAMINE HCL 50 MG/ML IJ SOLN
INTRAMUSCULAR | Status: DC | PRN
  Administered 2020-12-13: 6.25 mg via INTRAVENOUS

## 2020-12-13 MED ORDER — ONDANSETRON HCL 4 MG PO TABS: 4.0000 mg | ORAL_TABLET | Freq: Four times a day (QID) | ORAL | Status: DC | PRN

## 2020-12-13 MED ORDER — OXYCODONE HCL 5 MG/5ML PO SOLN: 5.0000 mg | Freq: Once | ORAL | Status: AC | PRN

## 2020-12-13 MED ORDER — TRANEXAMIC ACID-NACL 1000-0.7 MG/100ML-% IV SOLN
INTRAVENOUS | Status: DC | PRN
  Administered 2020-12-13: 1000 mg via INTRAVENOUS

## 2020-12-13 MED ORDER — DEXAMETHASONE SODIUM PHOSPHATE 10 MG/ML IJ SOLN
INTRAMUSCULAR | Status: AC
  Filled 2020-12-13: qty 1

## 2020-12-13 MED ORDER — DEXTROSE 5 % IV SOLN
INTRAVENOUS | Status: DC | PRN
  Administered 2020-12-13: 3 g via INTRAVENOUS

## 2020-12-13 MED ORDER — KETOROLAC TROMETHAMINE 15 MG/ML IJ SOLN
15.0000 mg | Freq: Four times a day (QID) | INTRAMUSCULAR | Status: AC
  Administered 2020-12-13 – 2020-12-14 (×4): 15 mg via INTRAVENOUS
  Filled 2020-12-13 (×4): qty 1

## 2020-12-13 MED ORDER — ROCURONIUM BROMIDE 10 MG/ML (PF) SYRINGE
PREFILLED_SYRINGE | INTRAVENOUS | Status: AC
  Filled 2020-12-13: qty 10

## 2020-12-13 MED ORDER — DIPHENHYDRAMINE HCL 50 MG/ML IJ SOLN
INTRAMUSCULAR | Status: AC
  Filled 2020-12-13: qty 1

## 2020-12-13 MED ORDER — ONDANSETRON HCL 4 MG/2ML IJ SOLN
INTRAMUSCULAR | Status: DC | PRN
  Administered 2020-12-13 (×2): 4 mg via INTRAVENOUS

## 2020-12-13 MED ORDER — FENTANYL CITRATE (PF) 100 MCG/2ML IJ SOLN
INTRAMUSCULAR | Status: AC
  Administered 2020-12-13: 50 ug via INTRAVENOUS
  Filled 2020-12-13: qty 2

## 2020-12-13 MED ORDER — HYDROMORPHONE HCL 1 MG/ML IJ SOLN
0.5000 mg | INTRAMUSCULAR | Status: DC | PRN
  Administered 2020-12-13 (×2): 0.5 mg via INTRAVENOUS
  Filled 2020-12-13 (×2): qty 0.5

## 2020-12-13 MED ORDER — PANTOPRAZOLE SODIUM 40 MG PO TBEC
40.0000 mg | DELAYED_RELEASE_TABLET | Freq: Every day | ORAL | Status: DC
  Administered 2020-12-14: 40 mg via ORAL
  Filled 2020-12-13: qty 1

## 2020-12-13 MED ORDER — OXYCODONE HCL 5 MG PO TABS
5.0000 mg | ORAL_TABLET | ORAL | Status: DC | PRN
  Administered 2020-12-13 – 2020-12-14 (×4): 10 mg via ORAL
  Filled 2020-12-13 (×4): qty 2

## 2020-12-13 MED ORDER — TRANEXAMIC ACID-NACL 1000-0.7 MG/100ML-% IV SOLN
1000.0000 mg | Freq: Once | INTRAVENOUS | Status: AC
  Administered 2020-12-13: 1000 mg via INTRAVENOUS
  Filled 2020-12-13: qty 100

## 2020-12-13 MED ORDER — ASPIRIN EC 325 MG PO TBEC
325.0000 mg | DELAYED_RELEASE_TABLET | Freq: Every day | ORAL | Status: DC
  Administered 2020-12-14: 325 mg via ORAL
  Filled 2020-12-13: qty 1

## 2020-12-13 MED ORDER — METOCLOPRAMIDE HCL 5 MG/ML IJ SOLN: 5.0000 mg | Freq: Three times a day (TID) | INTRAMUSCULAR | Status: DC | PRN

## 2020-12-13 MED ORDER — LIDOCAINE 2% (20 MG/ML) 5 ML SYRINGE
INTRAMUSCULAR | Status: DC | PRN
  Administered 2020-12-13: 40 mg via INTRAVENOUS

## 2020-12-13 MED ORDER — METOCLOPRAMIDE HCL 5 MG PO TABS: 5.0000 mg | ORAL_TABLET | Freq: Three times a day (TID) | ORAL | Status: DC | PRN

## 2020-12-13 MED ORDER — TRANEXAMIC ACID-NACL 1000-0.7 MG/100ML-% IV SOLN
INTRAVENOUS | Status: AC
  Filled 2020-12-13: qty 100

## 2020-12-13 MED ORDER — ONDANSETRON HCL 4 MG/2ML IJ SOLN
INTRAMUSCULAR | Status: AC
  Filled 2020-12-13: qty 4

## 2020-12-13 MED ORDER — VITAMIN D 25 MCG (1000 UNIT) PO TABS
5000.0000 [IU] | ORAL_TABLET | Freq: Every day | ORAL | Status: DC
  Administered 2020-12-13 – 2020-12-14 (×2): 5000 [IU] via ORAL
  Filled 2020-12-13 (×2): qty 5

## 2020-12-13 MED ORDER — FENTANYL CITRATE (PF) 100 MCG/2ML IJ SOLN
INTRAMUSCULAR | Status: DC | PRN
  Administered 2020-12-13 (×2): 50 ug via INTRAVENOUS
  Administered 2020-12-13: 100 ug via INTRAVENOUS
  Administered 2020-12-13 (×3): 50 ug via INTRAVENOUS

## 2020-12-13 MED ORDER — SUGAMMADEX SODIUM 200 MG/2ML IV SOLN
INTRAVENOUS | Status: DC | PRN
  Administered 2020-12-13: 300 mg via INTRAVENOUS

## 2020-12-13 MED ORDER — CLINDAMYCIN PHOSPHATE 900 MG/50ML IV SOLN
INTRAVENOUS | Status: AC
  Filled 2020-12-13: qty 50

## 2020-12-13 MED ORDER — DOCUSATE SODIUM 100 MG PO CAPS
100.0000 mg | ORAL_CAPSULE | Freq: Two times a day (BID) | ORAL | Status: DC
  Administered 2020-12-13 – 2020-12-14 (×3): 100 mg via ORAL
  Filled 2020-12-13 (×3): qty 1

## 2020-12-13 MED ORDER — ONDANSETRON HCL 4 MG/2ML IJ SOLN: 4.0000 mg | Freq: Once | INTRAMUSCULAR | Status: DC | PRN

## 2020-12-13 MED ORDER — CLINDAMYCIN PHOSPHATE 600 MG/50ML IV SOLN
600.0000 mg | Freq: Four times a day (QID) | INTRAVENOUS | Status: AC
  Administered 2020-12-13 (×2): 600 mg via INTRAVENOUS
  Filled 2020-12-13 (×2): qty 50

## 2020-12-13 MED ORDER — FENTANYL CITRATE (PF) 250 MCG/5ML IJ SOLN
INTRAMUSCULAR | Status: AC
  Filled 2020-12-13: qty 5

## 2020-12-13 MED ORDER — MIDAZOLAM HCL 2 MG/2ML IJ SOLN
INTRAMUSCULAR | Status: AC
  Filled 2020-12-13: qty 2

## 2020-12-13 MED ORDER — CEFAZOLIN SODIUM 1 G IJ SOLR
INTRAMUSCULAR | Status: AC
  Filled 2020-12-13: qty 30

## 2020-12-13 MED ORDER — ROCURONIUM BROMIDE 100 MG/10ML IV SOLN
INTRAVENOUS | Status: DC | PRN
  Administered 2020-12-13 (×2): 10 mg via INTRAVENOUS
  Administered 2020-12-13: 70 mg via INTRAVENOUS

## 2020-12-13 MED ORDER — MENTHOL 3 MG MT LOZG: 1.0000 | LOZENGE | OROMUCOSAL | Status: DC | PRN

## 2020-12-13 MED ORDER — CLINDAMYCIN PHOSPHATE 900 MG/50ML IV SOLN: 900.0000 mg | INTRAVENOUS | Status: DC

## 2020-12-13 MED ORDER — OXYCODONE HCL 5 MG PO TABS
5.0000 mg | ORAL_TABLET | Freq: Once | ORAL | Status: AC | PRN
  Administered 2020-12-13: 5 mg via ORAL

## 2020-12-13 MED ORDER — SODIUM CHLORIDE 0.9 % IV SOLN: INTRAVENOUS | Status: DC

## 2020-12-13 MED ORDER — ONDANSETRON HCL 4 MG/2ML IJ SOLN: 4.0000 mg | Freq: Four times a day (QID) | INTRAMUSCULAR | Status: DC | PRN

## 2020-12-13 MED ORDER — PROPOFOL 10 MG/ML IV BOLUS
INTRAVENOUS | Status: DC | PRN
  Administered 2020-12-13: 180 mg via INTRAVENOUS

## 2020-12-13 MED ORDER — ORAL CARE MOUTH RINSE: 15.0000 mL | Freq: Once | OROMUCOSAL | Status: AC

## 2020-12-13 MED ORDER — 0.9 % SODIUM CHLORIDE (POUR BTL) OPTIME
TOPICAL | Status: DC | PRN
  Administered 2020-12-13: 1000 mL

## 2020-12-13 MED ORDER — TRANEXAMIC ACID 1000 MG/10ML IV SOLN
2000.0000 mg | Freq: Once | INTRAVENOUS | Status: AC
  Administered 2020-12-13: 2000 mg via TOPICAL
  Filled 2020-12-13: qty 20

## 2020-12-13 MED ORDER — PHENOL 1.4 % MT LIQD: 1.0000 | OROMUCOSAL | Status: DC | PRN

## 2020-12-13 MED ORDER — LACTATED RINGERS IV SOLN: INTRAVENOUS | Status: DC

## 2020-12-13 MED ORDER — ALBUTEROL SULFATE HFA 108 (90 BASE) MCG/ACT IN AERS
INHALATION_SPRAY | RESPIRATORY_TRACT | Status: AC
  Filled 2020-12-13: qty 6.7

## 2020-12-13 MED ORDER — PROPOFOL 500 MG/50ML IV EMUL
INTRAVENOUS | Status: DC | PRN
  Administered 2020-12-13: 25 ug/kg/min via INTRAVENOUS

## 2020-12-13 MED ORDER — ORAL CARE MOUTH RINSE: 15.0000 mL | Freq: Once | OROMUCOSAL | Status: DC

## 2020-12-13 MED ORDER — MIDAZOLAM HCL 5 MG/5ML IJ SOLN
INTRAMUSCULAR | Status: DC | PRN
  Administered 2020-12-13: 2 mg via INTRAVENOUS

## 2020-12-13 MED ORDER — CHLORHEXIDINE GLUCONATE 0.12 % MT SOLN
15.0000 mL | Freq: Once | OROMUCOSAL | Status: DC
  Filled 2020-12-13: qty 15

## 2020-12-13 MED ORDER — ALBUTEROL SULFATE HFA 108 (90 BASE) MCG/ACT IN AERS
INHALATION_SPRAY | RESPIRATORY_TRACT | Status: DC | PRN
  Administered 2020-12-13: 2 via RESPIRATORY_TRACT

## 2020-12-13 MED ORDER — DEXAMETHASONE SODIUM PHOSPHATE 10 MG/ML IJ SOLN
INTRAMUSCULAR | Status: DC | PRN
  Administered 2020-12-13: 10 mg via INTRAVENOUS

## 2020-12-13 MED ORDER — ACETAMINOPHEN 325 MG PO TABS
325.0000 mg | ORAL_TABLET | Freq: Four times a day (QID) | ORAL | Status: DC | PRN
Start: 2020-12-14 — End: 2020-12-14
  Administered 2020-12-14: 650 mg via ORAL
  Filled 2020-12-13: qty 2

## 2020-12-13 MED ORDER — FENTANYL CITRATE (PF) 100 MCG/2ML IJ SOLN: 25.0000 ug | INTRAMUSCULAR | Status: DC | PRN

## 2020-12-13 MED ORDER — OXYCODONE HCL 5 MG PO TABS
ORAL_TABLET | ORAL | Status: AC
  Filled 2020-12-13: qty 1

## 2020-12-13 MED ORDER — CHLORHEXIDINE GLUCONATE 0.12 % MT SOLN
15.0000 mL | Freq: Once | OROMUCOSAL | Status: AC
  Administered 2020-12-13: 15 mL via OROMUCOSAL

## 2020-12-13 MED ORDER — ALBUTEROL SULFATE (2.5 MG/3ML) 0.083% IN NEBU: 2.5000 mg | INHALATION_SOLUTION | Freq: Four times a day (QID) | RESPIRATORY_TRACT | Status: DC | PRN

## 2020-12-13 MED ORDER — LIDOCAINE 2% (20 MG/ML) 5 ML SYRINGE
INTRAMUSCULAR | Status: AC
  Filled 2020-12-13: qty 5

## 2020-12-13 MED ORDER — PROPOFOL 10 MG/ML IV BOLUS
INTRAVENOUS | Status: AC
  Filled 2020-12-13: qty 40

## 2020-12-13 SURGICAL SUPPLY — 59 items
BLADE SAGITTAL 25.0X1.19X90 (BLADE) ×2 IMPLANT
BLADE SAW SGTL 13X75X1.27 (BLADE) ×2 IMPLANT
BLADE SURG 21 STRL SS (BLADE) ×4 IMPLANT
BNDG COHESIVE 6X5 TAN STRL LF (GAUZE/BANDAGES/DRESSINGS) ×4 IMPLANT
BNDG GAUZE ELAST 4 BULKY (GAUZE/BANDAGES/DRESSINGS) ×2 IMPLANT
BOWL SMART MIX CTS (DISPOSABLE) ×2 IMPLANT
CEMENT BONE R 1X40 (Cement) ×4 IMPLANT
COMP FEM PERS STD SZ7 RT (Joint) ×2 IMPLANT
COMPONENT FEM PERS STD SZ7 RT (Joint) ×1 IMPLANT
COOLER ICEMAN CLASSIC (MISCELLANEOUS) ×2 IMPLANT
COVER SURGICAL LIGHT HANDLE (MISCELLANEOUS) ×2 IMPLANT
COVER WAND RF STERILE (DRAPES) ×2 IMPLANT
CUFF TOURN SGL QUICK 34 (TOURNIQUET CUFF) ×1
CUFF TOURN SGL QUICK 42 (TOURNIQUET CUFF) IMPLANT
CUFF TRNQT CYL 34X4.125X (TOURNIQUET CUFF) ×1 IMPLANT
DRAPE EXTREMITY T 121X128X90 (DISPOSABLE) ×2 IMPLANT
DRAPE HALF SHEET 40X57 (DRAPES) ×4 IMPLANT
DRAPE U-SHAPE 47X51 STRL (DRAPES) ×2 IMPLANT
DRSG ADAPTIC 3X8 NADH LF (GAUZE/BANDAGES/DRESSINGS) ×2 IMPLANT
DRSG AQUACEL AG ADV 3.5X14 (GAUZE/BANDAGES/DRESSINGS) ×2 IMPLANT
DRSG PAD ABDOMINAL 8X10 ST (GAUZE/BANDAGES/DRESSINGS) ×2 IMPLANT
DURAPREP 26ML APPLICATOR (WOUND CARE) ×2 IMPLANT
ELECT REM PT RETURN 9FT ADLT (ELECTROSURGICAL) ×2
ELECTRODE REM PT RTRN 9FT ADLT (ELECTROSURGICAL) ×1 IMPLANT
FACESHIELD WRAPAROUND (MASK) ×2 IMPLANT
GAUZE SPONGE 4X4 12PLY STRL (GAUZE/BANDAGES/DRESSINGS) ×2 IMPLANT
GLOVE BIOGEL PI IND STRL 9 (GLOVE) ×1 IMPLANT
GLOVE BIOGEL PI INDICATOR 9 (GLOVE) ×1
GLOVE SURG ORTHO 9.0 STRL STRW (GLOVE) ×2 IMPLANT
GOWN STRL REUS W/ TWL XL LVL3 (GOWN DISPOSABLE) ×2 IMPLANT
GOWN STRL REUS W/TWL XL LVL3 (GOWN DISPOSABLE) ×2
HANDPIECE INTERPULSE COAX TIP (DISPOSABLE) ×1
HDLS TROCR DRIL PIN KNEE 75 (PIN) ×1
IMPL PATELLA METAL SZ32X10 (Joint) ×2 IMPLANT
KIT BASIN OR (CUSTOM PROCEDURE TRAY) ×2 IMPLANT
KIT TURNOVER KIT B (KITS) ×2 IMPLANT
MANIFOLD NEPTUNE II (INSTRUMENTS) ×2 IMPLANT
NS IRRIG 1000ML POUR BTL (IV SOLUTION) ×2 IMPLANT
PACK TOTAL JOINT (CUSTOM PROCEDURE TRAY) ×2 IMPLANT
PAD ARMBOARD 7.5X6 YLW CONV (MISCELLANEOUS) ×2 IMPLANT
PAD COLD SHLDR WRAP-ON (PAD) ×2 IMPLANT
PIN DRILL HDLS TROCAR 75 4PK (PIN) ×1 IMPLANT
PSN ASF PS VE R 6-9 KNEE 10 (Orthopedic Implant) ×2 IMPLANT
SCREW FEMALE HEX FIX 25X2.5 (ORTHOPEDIC DISPOSABLE SUPPLIES) ×2 IMPLANT
SET HNDPC FAN SPRY TIP SCT (DISPOSABLE) ×1 IMPLANT
STAPLER VISISTAT 35W (STAPLE) ×2 IMPLANT
STEM TIBIAL TRAB SZE RT (Stem) ×2 IMPLANT
SUCTION FRAZIER HANDLE 10FR (MISCELLANEOUS)
SUCTION TUBE FRAZIER 10FR DISP (MISCELLANEOUS) IMPLANT
SURFACE ARTC PRSNA 6-9 KNEE 10 (Orthopedic Implant) ×1 IMPLANT
SUT VIC AB 0 CT1 27 (SUTURE) ×2
SUT VIC AB 0 CT1 27XBRD ANBCTR (SUTURE) ×2 IMPLANT
SUT VIC AB 1 CT1 27 (SUTURE) ×1
SUT VIC AB 1 CT1 27XBRD ANBCTR (SUTURE) ×1 IMPLANT
SUT VIC AB 1 CTX 36 (SUTURE) ×1
SUT VIC AB 1 CTX36XBRD ANBCTR (SUTURE) IMPLANT
SUT VIC AB 1 CTX36XBRD ANBCTRL (SUTURE) ×1 IMPLANT
TOWEL GREEN STERILE (TOWEL DISPOSABLE) ×2 IMPLANT
TOWEL GREEN STERILE FF (TOWEL DISPOSABLE) ×2 IMPLANT

## 2020-12-13 NOTE — Progress Notes (Signed)
Nurse in room. Patient in bed with eyes closed.

## 2020-12-13 NOTE — Discharge Instructions (Signed)
INSTRUCTIONS AFTER JOINT REPLACEMENT   o Remove items at home which could result in a fall. This includes throw rugs or furniture in walking pathways o ICE to the affected joint every three hours while awake for 30 minutes at a time, for at least the first 3-5 days, and then as needed for pain and swelling.  Continue to use ice for pain and swelling. You may notice swelling that will progress down to the foot and ankle.  This is normal after surgery.  Elevate your leg when you are not up walking on it.   o Continue to use the breathing machine you got in the hospital (incentive spirometer) which will help keep your temperature down.  It is common for your temperature to cycle up and down following surgery, especially at night when you are not up moving around and exerting yourself.  The breathing machine keeps your lungs expanded and your temperature down.   DIET:  As you were doing prior to hospitalization, we recommend a well-balanced diet.  DRESSING / WOUND CARE / SHOWERING  Keep Dressing Dry and in place  ACTIVITY  o Increase activity slowly as tolerated, but follow the weight bearing instructions below.   o No driving for 6 weeks or until further direction given by your physician.  You cannot drive while taking narcotics.  o No lifting or carrying greater than 10 lbs. until further directed by your surgeon. o Avoid periods of inactivity such as sitting longer than an hour when not asleep. This helps prevent blood clots.  o You may return to work once you are authorized by your doctor.     WEIGHT BEARING   Weight bearing as tolerated with assist device (walker, cane, etc) as directed, use it as long as suggested by your surgeon or therapist, typically at least 4-6 weeks.   EXERCISES  Results after joint replacement surgery are often greatly improved when you follow the exercise, range of motion and muscle strengthening exercises prescribed by your doctor. Safety measures are also  important to protect the joint from further injury. Any time any of these exercises cause you to have increased pain or swelling, decrease what you are doing until you are comfortable again and then slowly increase them. If you have problems or questions, call your caregiver or physical therapist for advice.   Rehabilitation is important following a joint replacement. After just a few days of immobilization, the muscles of the leg can become weakened and shrink (atrophy).  These exercises are designed to build up the tone and strength of the thigh and leg muscles and to improve motion. Often times heat used for twenty to thirty minutes before working out will loosen up your tissues and help with improving the range of motion but do not use heat for the first two weeks following surgery (sometimes heat can increase post-operative swelling).   These exercises can be done on a training (exercise) mat, on the floor, on a table or on a bed. Use whatever works the best and is most comfortable for you.    Use music or television while you are exercising so that the exercises are a pleasant break in your day. This will make your life better with the exercises acting as a break in your routine that you can look forward to.   Perform all exercises about fifteen times, three times per day or as directed.  You should exercise both the operative leg and the other leg as well.  Exercises  include:   . Quad Sets - Tighten up the muscle on the front of the thigh (Quad) and hold for 5-10 seconds.   . Straight Leg Raises - With your knee straight (if you were given a brace, keep it on), lift the leg to 60 degrees, hold for 3 seconds, and slowly lower the leg.  Perform this exercise against resistance later as your leg gets stronger.  . Leg Slides: Lying on your back, slowly slide your foot toward your buttocks, bending your knee up off the floor (only go as far as is comfortable). Then slowly slide your foot back down until  your leg is flat on the floor again.  Glenard Haring Wings: Lying on your back spread your legs to the side as far apart as you can without causing discomfort.  . Hamstring Strength:  Lying on your back, push your heel against the floor with your leg straight by tightening up the muscles of your buttocks.  Repeat, but this time bend your knee to a comfortable angle, and push your heel against the floor.  You may put a pillow under the heel to make it more comfortable if necessary.   A rehabilitation program following joint replacement surgery can speed recovery and prevent re-injury in the future due to weakened muscles. Contact your doctor or a physical therapist for more information on knee rehabilitation.    CONSTIPATION  Constipation is defined medically as fewer than three stools per week and severe constipation as less than one stool per week.  Even if you have a regular bowel pattern at home, your normal regimen is likely to be disrupted due to multiple reasons following surgery.  Combination of anesthesia, postoperative narcotics, change in appetite and fluid intake all can affect your bowels.   YOU MUST use at least one of the following options; they are listed in order of increasing strength to get the job done.  They are all available over the counter, and you may need to use some, POSSIBLY even all of these options:    Drink plenty of fluids (prune juice may be helpful) and high fiber foods Colace 100 mg by mouth twice a day  Senokot for constipation as directed and as needed Dulcolax (bisacodyl), take with full glass of water  Miralax (polyethylene glycol) once or twice a day as needed.  If you have tried all these things and are unable to have a bowel movement in the first 3-4 days after surgery call either your surgeon or your primary doctor.    If you experience loose stools or diarrhea, hold the medications until you stool forms back up.  If your symptoms do not get better within 1 week  or if they get worse, check with your doctor.  If you experience "the worst abdominal pain ever" or develop nausea or vomiting, please contact the office immediately for further recommendations for treatment.   ITCHING:  If you experience itching with your medications, try taking only a single pain pill, or even half a pain pill at a time.  You can also use Benadryl over the counter for itching or also to help with sleep.   TED HOSE STOCKINGS:  Use stockings on both legs until for at least 2 weeks or as directed by physician office. They may be removed at night for sleeping.  MEDICATIONS:  See your medication summary on the "After Visit Summary" that nursing will review with you.  You may have some home medications which will be  placed on hold until you complete the course of blood thinner medication.  It is important for you to complete the blood thinner medication as prescribed.  PRECAUTIONS:  If you experience chest pain or shortness of breath - call 911 immediately for transfer to the hospital emergency department.   If you develop a fever greater that 101 F, purulent drainage from wound, increased redness or drainage from wound, foul odor from the wound/dressing, or calf pain - CONTACT YOUR SURGEON.                                                   FOLLOW-UP APPOINTMENTS:  If you do not already have a post-op appointment, please call the office for an appointment to be seen by your surgeon.  Guidelines for how soon to be seen are listed in your "After Visit Summary", but are typically between 1-4 weeks after surgery.  OTHER INSTRUCTIONS:   Knee Replacement:  Do not place pillow under knee, focus on keeping the knee straight while resting. CPM instructions: 0-90 degrees, 2 hours in the morning, 2 hours in the afternoon, and 2 hours in the evening. Place foam block, curve side up under heel at all times except when in CPM or when walking.  DO NOT modify, tear, cut, or change the foam block in any  way.  POST-OPERATIVE OPIOID TAPER INSTRUCTIONS: . It is important to wean off of your opioid medication as soon as possible. If you do not need pain medication after your surgery it is ok to stop day one. Marland Kitchen Opioids include: o Codeine, Hydrocodone(Norco, Vicodin), Oxycodone(Percocet, oxycontin) and hydromorphone amongst others.  . Long term and even short term use of opiods can cause: o Increased pain response o Dependence o Constipation o Depression o Respiratory depression o And more.  . Withdrawal symptoms can include o Flu like symptoms o Nausea, vomiting o And more . Techniques to manage these symptoms o Hydrate well o Eat regular healthy meals o Stay active o Use relaxation techniques(deep breathing, meditating, yoga) . Do Not substitute Alcohol to help with tapering . If you have been on opioids for less than two weeks and do not have pain than it is ok to stop all together.  . Plan to wean off of opioids o This plan should start within one week post op of your joint replacement. o Maintain the same interval or time between taking each dose and first decrease the dose.  o Cut the total daily intake of opioids by one tablet each day o Next start to increase the time between doses. o The last dose that should be eliminated is the evening dose.     MAKE SURE YOU:  . Understand these instructions.  . Get help right away if you are not doing well or get worse.    Thank you for letting us be a part of your medical care team.  It is a privilege we respect greatly.  We hope these instructions will help you stay on track for a fast and full recovery!     2

## 2020-12-13 NOTE — Transfer of Care (Signed)
Immediate Anesthesia Transfer of Care Note  Patient: Sally Hancock  Procedure(s) Performed: RIGHT TOTAL KNEE ARTHROPLASTY (Right Knee)  Patient Location: PACU  Anesthesia Type:General and Regional  Level of Consciousness: drowsy and patient cooperative  Airway & Oxygen Therapy: Patient Spontanous Breathing and Patient connected to face mask oxygen  Post-op Assessment: Report given to RN and Post -op Vital signs reviewed and stable  Post vital signs: Reviewed and stable  Last Vitals:  Vitals Value Taken Time  BP 142/85 12/13/20 0930  Temp 36.4 C 12/13/20 0930  Pulse 96 12/13/20 0934  Resp 29 12/13/20 0934  SpO2 97 % 12/13/20 0934  Vitals shown include unvalidated device data.  Last Pain:  Vitals:   12/13/20 0633  TempSrc:   PainSc: 8       Patients Stated Pain Goal: 3 (12/13/20 0254)  Complications: No complications documented.

## 2020-12-13 NOTE — Plan of Care (Signed)
  Problem: Activity: Goal: Risk for activity intolerance will decrease Outcome: Progressing   Problem: Pain Managment: Goal: General experience of comfort will improve Outcome: Progressing   Problem: Safety: Goal: Ability to remain free from injury will improve Outcome: Progressing   

## 2020-12-13 NOTE — Progress Notes (Signed)
Patient called for pain med. Nurse came in room with pain med. Patient was sleeping soundly.  Informed husband will come back later.

## 2020-12-13 NOTE — Anesthesia Preprocedure Evaluation (Addendum)
Anesthesia Evaluation  Patient identified by MRN, date of birth, ID band Patient awake    Reviewed: Allergy & Precautions, NPO status , Patient's Chart, lab work & pertinent test results  Airway Mallampati: II  TM Distance: >3 FB Neck ROM: Full    Dental  (+) Teeth Intact, Dental Advisory Given   Pulmonary Current Smoker,    breath sounds clear to auscultation       Cardiovascular  Rhythm:Regular Rate:Normal     Neuro/Psych    GI/Hepatic   Endo/Other    Renal/GU      Musculoskeletal   Abdominal (+) + obese,   Peds  Hematology   Anesthesia Other Findings   Reproductive/Obstetrics                             Anesthesia Physical Anesthesia Plan  ASA: III  Anesthesia Plan: General   Post-op Pain Management:    Induction: Intravenous  PONV Risk Score and Plan: Ondansetron and Dexamethasone  Airway Management Planned: Oral ETT  Additional Equipment:   Intra-op Plan:   Post-operative Plan: Extubation in OR  Informed Consent: I have reviewed the patients History and Physical, chart, labs and discussed the procedure including the risks, benefits and alternatives for the proposed anesthesia with the patient or authorized representative who has indicated his/her understanding and acceptance.     Dental advisory given  Plan Discussed with: CRNA and Anesthesiologist  Anesthesia Plan Comments:         Anesthesia Quick Evaluation  

## 2020-12-13 NOTE — Evaluation (Signed)
Physical Therapy Evaluation Patient Details Name: Sally Hancock MRN: [id number] DOB: Aug 01, 1976 Today's Date: 12/13/2020   History of Present Illness  Pt is a 45 y/o female s/p R TKA on 4/29. PMH includes fibromyalgia and R hip fx surgery.  Clinical Impression  Pt is s/p surgery above with deficits below. Pt requiring min to min guard A for transfers and to take side steps at EOB this session. Pt reporting increased pain which limited further mobility; RN notified at end of session that pt requesting pain meds. Reviewed knee precautions and HEP. Will continue to follow acutely.     Follow Up Recommendations Follow surgeon's recommendation for DC plan and follow-up therapies    Equipment Recommendations  3in1 (PT)    Recommendations for Other Services       Precautions / Restrictions Precautions Precautions: Knee Precaution Booklet Issued: Yes (comment) Precaution Comments: Reviewed knee precautions with pt. Restrictions Weight Bearing Restrictions: Yes RLE Weight Bearing: Weight bearing as tolerated      Mobility  Bed Mobility Overal bed mobility: Needs Assistance Bed Mobility: Supine to Sit;Sit to Supine     Supine to sit: Min guard Sit to supine: Min guard   General bed mobility comments: Min guard for safety. Pt with increased pain and requiring increased time. Hooking LLE under RLE to assist with movement.    Transfers Overall transfer level: Needs assistance Equipment used: Rolling walker (2 wheeled) Transfers: Sit to/from Stand Sit to Stand: Min assist         General transfer comment: Min A for lift assist. Cues for hand placement.  Ambulation/Gait Ambulation/Gait assistance: Min guard   Assistive device: Rolling walker (2 wheeled)       General Gait Details: Took side steps at EOB. Further mobility limited secondary to pain. Cues for sequencing using RW. Limited weightshift to RLE.  Stairs            Wheelchair Mobility    Modified Rankin  (Stroke Patients Only)       Balance Overall balance assessment: Needs assistance Sitting-balance support: No upper extremity supported;Feet supported Sitting balance-Leahy Scale: Fair     Standing balance support: Bilateral upper extremity supported;During functional activity Standing balance-Leahy Scale: Poor Standing balance comment: Reliant on BUE support                             Pertinent Vitals/Pain Pain Assessment: Faces Faces Pain Scale: Hurts whole lot Pain Location: R knee Pain Descriptors / Indicators: Aching;Operative site guarding Pain Intervention(s): Monitored during session;Limited activity within patient's tolerance;Repositioned    Home Living Family/patient expects to be discharged to:: Private residence Living Arrangements: Spouse/significant other Available Help at Discharge: Family;Available 24 hours/day Type of Home: House Home Access: Ramped entrance     Home Layout: One level Home Equipment: Walker - 2 wheels;Cane - single point;Tub bench      Prior Function Level of Independence: Independent         Comments: Works as an Ship broker        Extremity/Trunk Assessment   Upper Extremity Assessment Upper Extremity Assessment: Overall WFL for tasks assessed    Lower Extremity Assessment Lower Extremity Assessment: RLE deficits/detail RLE Deficits / Details: Deficits consistent with post op pain and weakness.    Cervical / Trunk Assessment Cervical / Trunk Assessment: Normal  Communication   Communication: No difficulties  Cognition Arousal/Alertness: Awake/alert Behavior During Therapy: WFL for tasks  assessed/performed Overall Cognitive Status: Within Functional Limits for tasks assessed                                        General Comments General comments (skin integrity, edema, etc.): Pt's husband present during session    Exercises Other Exercises Other Exercises: Verbally  reviewed HEP including ankle pumps, heel slides and quad sets to perform.   Assessment/Plan    PT Assessment Patient needs continued PT services  PT Problem List Decreased balance;Decreased mobility;Decreased activity tolerance;Decreased knowledge of use of DME;Decreased range of motion;Decreased strength;Pain       PT Treatment Interventions DME instruction;Gait training;Stair training;Functional mobility training;Therapeutic activities;Therapeutic exercise;Balance training;Patient/family education    PT Goals (Current goals can be found in the Care Plan section)  Acute Rehab PT Goals Patient Stated Goal: to decrease pain PT Goal Formulation: With patient Time For Goal Achievement: 12/27/20 Potential to Achieve Goals: Fair    Frequency 7X/week   Barriers to discharge        Co-evaluation               AM-PAC PT "6 Clicks" Mobility  Outcome Measure Help needed turning from your back to your side while in a flat bed without using bedrails?: A Little Help needed moving from lying on your back to sitting on the side of a flat bed without using bedrails?: A Little Help needed moving to and from a bed to a chair (including a wheelchair)?: A Little Help needed standing up from a chair using your arms (e.g., wheelchair or bedside chair)?: A Little Help needed to walk in hospital room?: A Little Help needed climbing 3-5 steps with a railing? : A Lot 6 Click Score: 17    End of Session   Activity Tolerance: Patient limited by pain Patient left: in bed;with call bell/phone within reach;with family/visitor present Nurse Communication: Mobility status;Patient requests pain meds PT Visit Diagnosis: Difficulty in walking, not elsewhere classified (R26.2);Pain Pain - Right/Left: Right Pain - part of body: Knee    Time: 1352-1415 PT Time Calculation (min) (ACUTE ONLY): 23 min   Charges:   PT Evaluation $PT Eval Low Complexity: 1 Low PT Treatments $Therapeutic Activity: 8-22  mins        Cindee Salt, DPT  Acute Rehabilitation Services  Pager: [phone] Office: [phone]   Lehman Prom 12/13/2020, 4:27 PM

## 2020-12-13 NOTE — Op Note (Addendum)
DATE OF SURGERY:  12/13/2020  TIME: 9:25 AM  PATIENT NAME:  Sally Hancock    AGE: 45 y.o.    PRE-OPERATIVE DIAGNOSIS:  Osteoarthritis Right Knee  POST-OPERATIVE DIAGNOSIS:  Osteoarthritis Right Knee  PROCEDURE:  Procedure(s): RIGHT TOTAL KNEE ARTHROPLASTY  SURGEON: Aldean Baker    OPERATIVE IMPLANTS: Depuy , Posterior Stabilized.  Femur size 7, Tibia size E, Patella size 32 3-peg oval button, with a 10 mm polyethylene insert.  @ENCIMAGES @       PREOPERATIVE INDICATIONS:   Sally Hancock is a 45 y.o. year old female with end stage degenerative arthritis of the knee who failed conservative treatment and elected for Total Knee Arthroplasty.   The risks, benefits, and alternatives were discussed at length including but not limited to the risks of infection, bleeding, nerve injury, stiffness, blood clots, the need for revision surgery, cardiopulmonary complications, among others, and they were willing to proceed.  OPERATIVE DESCRIPTION:  The patient was brought to the operative room and placed in a supine position.  General anesthesia was administered.  IV antibiotics were given.  The lower extremity was prepped and draped in the usual sterile fashion.  45 was used to cover all exposed skin. Time out was performed.    Anterior quadriceps tendon splitting approach was performed.  The patella was everted and osteophytes were removed.  The anterior horn of the medial and lateral meniscus was removed.   The distal femur was opened with the drill and the intramedullary distal femoral cutting jig was utilized, set at 5 degrees valgus resecting 9 mm off the distal femur.  Care was taken to protect the collateral ligaments.  Then the extramedullary tibial cutting jig was utilized set for 3 degree posterior slope.  Care was taken during the cut to protect the medial and collateral ligaments.  The proximal tibia was removed along with the posterior horns of the menisci.  The PCL was  sacrificed.    The extensor gap was measured and was approximately 10 mm.    The distal femoral sizing jig was applied, taking care to avoid notching.  Then the 4-in-1 cutting jig was applied and the anterior and posterior femur was cut, along with the chamfer cuts.  All posterior osteophytes were removed.  The flexion gap was then measured and was symmetric with the extension gap.  The distal femoral preparation using the appropriate jig to prepare the box.  The patella was then measured, and cut with the saw.    The proximal tibia sized and prepared accordingly with the reamer and the punch, and then all components were trialed with the poly insert.  The knee was found to have stable balance and full motion.  The knee was irrigated with normal saline and the knee was soaked with TXA.  The above named components were then cemented into place and all excess cement was removed.  The final polyethylene component was in place during cementation.  The initial plan was to use press-fit components due to the patient's young age.  When the tibial tray was impacted this did not have a stable fit and it was elected at this time to cemented all 3 components.  The knee was kept in extension until the cement hardened.  The knee was then taken through a range of motion and the patella tracked well and the knee irrigated copiously and the parapatellar and subcutaneous tissue closed with vicryl, and skin closed with staples..  A sterile dressing was applied and patient  was taken to the PACU in stable  condition.  There were no complications.  Total tourniquet time was 51 minutes.

## 2020-12-13 NOTE — Plan of Care (Signed)
  Problem: Clinical Measurements: Goal: Will remain free from infection Outcome: Progressing Goal: Diagnostic test results will improve Outcome: Progressing   

## 2020-12-13 NOTE — Progress Notes (Signed)
IV site infiltrated and pt  C/o pain at site. Attempted to insertion new site x2 and not successful.

## 2020-12-13 NOTE — H&P (Signed)
TOTAL KNEE ADMISSION H&P  Patient is being admitted for right total knee arthroplasty.  Subjective:  Chief Complaint:right knee pain.  HPI: Sally Hancock, 45 y.o. female, has a history of pain and functional disability in the right knee due to arthritis and has failed non-surgical conservative treatments for greater than 12 weeks to includeNSAID's and/or analgesics and activity modification.  Onset of symptoms was gradual, starting 7 years ago with gradually worsening course since that time. The patient noted no past surgery on the right knee(s).  Patient currently rates pain in the right knee(s) at 6 out of 10 with activity. Patient has worsening of pain with activity and weight bearing and pain that interferes with activities of daily living.  Patient has evidence of subchondral cysts and joint space narrowing by imaging studies. This patient has had . There is no active infection.  There are no problems to display for this patient.  Past Medical History:  Diagnosis Date  . Arthritis   . Chronic bronchitis (HCC)   . Fibromyalgia   . GERD (gastroesophageal reflux disease)   . Headache    migraines  . History of hiatal hernia   . Trauma   . Vaginal Pap smear, abnormal     Past Surgical History:  Procedure Laterality Date  . CESAREAN SECTION    . CHOLECYSTECTOMY  2012  . FRACTURE SURGERY Right 08/2015   acetabulum fx  . HIP SURGERY    . KNEE SURGERY    . TONSILLECTOMY     @ 45 years old    Current Facility-Administered Medications  Medication Dose Route Frequency Provider Last Rate Last Admin  . chlorhexidine (PERIDEX) 0.12 % solution 15 mL  15 mL Mouth/Throat Once Kipp Brood, MD       Or  . MEDLINE mouth rinse  15 mL Mouth Rinse Once Kipp Brood, MD      . chlorhexidine (PERIDEX) 0.12 % solution 15 mL  15 mL Mouth/Throat Once Nadara Mustard, MD       Or  . MEDLINE mouth rinse  15 mL Mouth Rinse Once Nadara Mustard, MD      . clindamycin (CLEOCIN) 900 MG/50ML IVPB            . clindamycin (CLEOCIN) IVPB 900 mg  900 mg Intravenous On Call to OR Valeria Boza, [place], [place]      . lactated ringers infusion   Intravenous Continuous Kipp Brood, MD      . lactated ringers infusion   Intravenous Continuous Nadara Mustard, MD       Allergies  Allergen Reactions  . Penicillins Rash    Social History   Tobacco Use  . Smoking status: Current Every Day Smoker    Packs/day: 0.50    Years: 5.00    Pack years: 2.50    Types: Cigarettes  . Smokeless tobacco: Never Used  Substance Use Topics  . Alcohol use: Never    Family History  Problem Relation Age of Onset  . Breast cancer Maternal Aunt      Review of Systems  All other systems reviewed and are negative.   Objective:  Physical Exam  Vital signs in last 24 hours: Temp:  [98 F (36.7 C)] 98 F (36.7 C) (04/29 0558) Pulse Rate:  [83] 83 (04/29 0558) Resp:  [18] 18 (04/29 0558) BP: (124)/(69) 124/69 (04/29 0558) SpO2:  [91 %] 91 % (04/29 0558) Weight:  [137.9 kg] 137.9 kg (04/29 8182)  Labs:   Estimated  body mass index is 50.59 kg/m as calculated from the following:   Height as of this encounter: 5\' 5"  (1.651 m).   Weight as of this encounter: 137.9 kg.   Imaging Review Plain radiographs demonstrate moderate degenerative joint disease of the right knee(s). The overall alignment isneutral. The bone quality appears to be good for age and reported activity level.      Assessment/Plan:  End stage arthritis, right knee   The patient history, physical examination, clinical judgment of the provider and imaging studies are consistent with end stage degenerative joint disease of the right knee(s) and total knee arthroplasty is deemed medically necessary. The treatment options including medical management, injection therapy arthroscopy and arthroplasty were discussed at length. The risks and benefits of total knee arthroplasty were presented and reviewed. The risks due to aseptic loosening,  infection, stiffness, patella tracking problems, thromboembolic complications and other imponderables were discussed. The patient acknowledged the explanation, agreed to proceed with the plan and consent was signed. Patient is being admitted for inpatient treatment for surgery, pain control, PT, OT, prophylactic antibiotics, VTE prophylaxis, progressive ambulation and ADL's and discharge planning. The patient is planning to be discharged home with home health services     Patient's anticipated LOS is less than 2 midnights, meeting these requirements: - Younger than 32 - Lives within 1 hour of care - Has a competent adult at home to recover with post-op recover - NO history of  - Chronic pain requiring opiods  - Diabetes  - Coronary Artery Disease  - Heart failure  - Heart attack  - Stroke  - DVT/VTE  - Cardiac arrhythmia  - Respiratory Failure/COPD  - Renal failure  - Anemia  - Advanced Liver disease

## 2020-12-13 NOTE — Anesthesia Procedure Notes (Signed)
Procedure Name: Intubation Date/Time: 12/13/2020 7:39 AM Performed by: Marny Lowenstein, CRNA Pre-anesthesia Checklist: Patient identified, Emergency Drugs available, Suction available and Patient being monitored Patient Re-evaluated:Patient Re-evaluated prior to induction Oxygen Delivery Method: Circle system utilized Preoxygenation: Pre-oxygenation with 100% oxygen Induction Type: IV induction Ventilation: Mask ventilation without difficulty Laryngoscope Size: Miller and 2 Grade View: Grade I Tube type: Oral Tube size: 7.0 mm Number of attempts: 1 Airway Equipment and Method: Patient positioned with wedge pillow and Stylet Placement Confirmation: ETT inserted through vocal cords under direct vision,  positive ETCO2 and breath sounds checked- equal and bilateral Secured at: 21 cm Tube secured with: Tape Dental Injury: Teeth and Oropharynx as per pre-operative assessment

## 2020-12-13 NOTE — Interval H&P Note (Signed)
History and Physical Interval Note:  12/13/2020 6:56 AM  Sally Hancock  has presented today for surgery, with the diagnosis of Osteoarthritis Right Knee.  The various methods of treatment have been discussed with the patient and family. After consideration of risks, benefits and other options for treatment, the patient has consented to  Procedure(s): RIGHT TOTAL KNEE ARTHROPLASTY (Right) as a surgical intervention.  The patient's history has been reviewed, patient examined, no change in status, stable for surgery.  I have reviewed the patient's chart and labs.  Questions were answered to the patient's satisfaction.     Nadara Mustard

## 2020-12-13 NOTE — Progress Notes (Signed)
Husband to station asked for pain med. Nurse to room, pt cried wanting pain meds.Give pt dilaudid,  Pt stated only Morphin work for her, dilaudid doesn't work.

## 2020-12-14 DIAGNOSIS — M1711 Unilateral primary osteoarthritis, right knee: Secondary | ICD-10-CM | POA: Diagnosis not present

## 2020-12-14 MED ORDER — DIPHENHYDRAMINE HCL 25 MG PO CAPS
25.0000 mg | ORAL_CAPSULE | Freq: Four times a day (QID) | ORAL | Status: DC | PRN
  Administered 2020-12-14: 25 mg via ORAL
  Filled 2020-12-14: qty 1

## 2020-12-14 MED ORDER — FLUCONAZOLE 150 MG PO TABS: 150.0000 mg | ORAL_TABLET | Freq: Once | ORAL | 0 refills | Status: AC

## 2020-12-14 MED ORDER — DIPHENHYDRAMINE HCL 25 MG PO CAPS: 50.0000 mg | ORAL_CAPSULE | Freq: Four times a day (QID) | ORAL | Status: DC | PRN

## 2020-12-14 MED ORDER — OXYCODONE-ACETAMINOPHEN 10-325 MG PO TABS: 1.0000 | ORAL_TABLET | ORAL | 0 refills | Status: DC | PRN

## 2020-12-14 MED ORDER — ASPIRIN 325 MG PO TBEC: 325.0000 mg | DELAYED_RELEASE_TABLET | Freq: Every day | ORAL | 0 refills | Status: AC

## 2020-12-14 NOTE — Plan of Care (Signed)
  Problem: Education: Goal: Knowledge of General Education information will improve Description: Including pain rating scale, medication(s)/side effects and non-pharmacologic comfort measures Outcome: Adequate for Discharge   Problem: Health Behavior/Discharge Planning: Goal: Ability to manage health-related needs will improve Outcome: Adequate for Discharge   Problem: Clinical Measurements: Goal: Ability to maintain clinical measurements within normal limits will improve Outcome: Adequate for Discharge Goal: Will remain free from infection Outcome: Adequate for Discharge Goal: Diagnostic test results will improve Outcome: Adequate for Discharge Goal: Respiratory complications will improve Outcome: Adequate for Discharge Goal: Cardiovascular complication will be avoided Outcome: Adequate for Discharge   Problem: Activity: Goal: Risk for activity intolerance will decrease Outcome: Adequate for Discharge   Problem: Nutrition: Goal: Adequate nutrition will be maintained Outcome: Adequate for Discharge   Problem: Coping: Goal: Level of anxiety will decrease Outcome: Adequate for Discharge   Problem: Elimination: Goal: Will not experience complications related to bowel motility Outcome: Adequate for Discharge Goal: Will not experience complications related to urinary retention Outcome: Adequate for Discharge   Problem: Pain Managment: Goal: General experience of comfort will improve Outcome: Adequate for Discharge   Problem: Safety: Goal: Ability to remain free from injury will improve Outcome: Adequate for Discharge   Problem: Skin Integrity: Goal: Risk for impaired skin integrity will decrease Outcome: Adequate for Discharge   Problem: Acute Rehab PT Goals(only PT should resolve) Goal: Pt Will Go Supine/Side To Sit Outcome: Adequate for Discharge Goal: Pt Will Go Sit To Supine/Side Outcome: Adequate for Discharge Goal: Patient Will Transfer Sit To/From  Stand Outcome: Adequate for Discharge Goal: Pt Will Ambulate Outcome: Adequate for Discharge   

## 2020-12-14 NOTE — Anesthesia Procedure Notes (Signed)
Anesthesia Regional Block: Adductor canal block   Pre-Anesthetic Checklist: ,, timeout performed, Correct Patient, Correct Site, Correct Laterality, Correct Procedure, Correct Position, site marked, Risks and benefits discussed, pre-op evaluation,  At surgeon's request and post-op pain management  Laterality: Right  Prep: Maximum Sterile Barrier Precautions used, chloraprep       Needles:  Injection technique: Single-shot  Needle Type: Echogenic Stimulator Needle     Needle Length: 9cm  Needle Gauge: 21     Additional Needles:   Procedures:,,,, ultrasound used (permanent image in chart),,,,  Narrative:  Start time: 12/14/2020 7:00 AM End time: 12/14/2020 7:10 AM Injection made incrementally with aspirations every 5 mL.  Performed by: Personally  Anesthesiologist: Kipp Brood, MD  Additional Notes: 25 cc 0.5% Bupivacaine 10 cc Exparel 1.3%

## 2020-12-14 NOTE — Evaluation (Signed)
Occupational Therapy Evaluation Patient Details Name: Sally Hancock MRN: [id number] DOB: Sep 22, 1975 Today's Date: 12/14/2020    History of Present Illness Pt is a 45 y/o female s/p R TKA on 4/29. PMH includes fibromyalgia and R hip fx surgery.   Clinical Impression   Pt doing well and is at min A level with LB selfcare. Pt is a Engineer, civil (consulting), is familiar with ADL A/E, and will have assist at home form her husband as needed. All education completed and no further acute OT services are indicated at this time    Follow Up Recommendations  No OT follow up;Supervision - Intermittent    Equipment Recommendations  None recommended by OT    Recommendations for Other Services       Precautions / Restrictions Precautions Precautions: Knee Precaution Booklet Issued: Yes (comment) Precaution Comments: Reviewed knee precautions with pt. Restrictions Weight Bearing Restrictions: Yes RLE Weight Bearing: Weight bearing as tolerated      Mobility Bed Mobility Overal bed mobility: Needs Assistance Bed Mobility: Supine to Sit     Supine to sit: Supervision     General bed mobility comments: pt in recliner upon arrival    Transfers Overall transfer level: Needs assistance Equipment used: Rolling walker (2 wheeled) Transfers: Sit to/from Stand Sit to Stand: Supervision         General transfer comment: pt able to rise from EOB with supervision for safety.    Balance Overall balance assessment: Needs assistance Sitting-balance support: No upper extremity supported;Feet supported Sitting balance-Leahy Scale: Fair     Standing balance support: Bilateral upper extremity supported;During functional activity Standing balance-Leahy Scale: Poor Standing balance comment: Reliant on BUE support                           ADL either performed or assessed with clinical judgement   ADL Overall ADL's : Needs assistance/impaired Eating/Feeding: Independent;Sitting   Grooming:  Wash/dry hands;Wash/dry face;Supervision/safety;With caregiver independent assisting   Upper Body Bathing: Independent;Sitting;With caregiver independent assisting   Lower Body Bathing: Minimal assistance;With caregiver independent assisting   Upper Body Dressing : Independent;Sitting;With caregiver independent assisting   Lower Body Dressing: Minimal assistance;With caregiver independent assisting   Toilet Transfer: Supervision/safety;Ambulation;RW;Comfort height toilet   Toileting- Clothing Manipulation and Hygiene: Supervision/safety;Sit to/from stand       Functional mobility during ADLs: Supervision/safety;Rolling walker General ADL Comments: reviewed ADL A/E for LB selfcare     Vision Patient Visual Report: No change from baseline       Perception     Praxis      Pertinent Vitals/Pain Pain Assessment: 0-10 Pain Score: 5  Pain Location: R knee Pain Descriptors / Indicators: Aching;Operative site guarding Pain Intervention(s): Monitored during session;Premedicated before session;Repositioned     Hand Dominance Right   Extremity/Trunk Assessment Upper Extremity Assessment Upper Extremity Assessment: Overall WFL for tasks assessed   Lower Extremity Assessment Lower Extremity Assessment: Defer to PT evaluation   Cervical / Trunk Assessment Cervical / Trunk Assessment: Normal   Communication Communication Communication: No difficulties   Cognition Arousal/Alertness: Awake/alert Behavior During Therapy: WFL for tasks assessed/performed Overall Cognitive Status: Within Functional Limits for tasks assessed                                     General Comments  Able to perform peri care w/o assist. Husband present and helpful  Exercises Exercises: Other exercises;Total Joint Total Joint Exercises Short Arc Quad: AAROM;Right;10 reps;Seated Heel Slides: AROM;Right;10 reps;Seated Hip ABduction/ADduction: AAROM;Right;10 reps;Seated Straight Leg  Raises: AAROM;Right;10 reps;Seated Long Arc Quad: AAROM;Right;10 reps;Seated Knee Flexion: AAROM;Right;5 reps;Seated (5 sec holds)   Shoulder Instructions      Home Living Family/patient expects to be discharged to:: Private residence Living Arrangements: Spouse/significant other Available Help at Discharge: Family;Available 24 hours/day Type of Home: House Home Access: Ramped entrance     Home Layout: One level     Bathroom Shower/Tub: Chief Strategy Officer: Standard     Home Equipment: Environmental consultant - 2 wheels;Cane - single point;Tub bench          Prior Functioning/Environment Level of Independence: Independent        Comments: Works as an Media planner Problem List: Decreased activity tolerance;Pain      OT Treatment/Interventions:      OT Goals(Current goals can be found in the care plan section) Acute Rehab OT Goals Patient Stated Goal: to decrease pain OT Goal Formulation: With patient/family  OT Frequency:     Barriers to D/C:            Co-evaluation              AM-PAC OT "6 Clicks" Daily Activity     Outcome Measure Help from another person eating meals?: None Help from another person taking care of personal grooming?: None Help from another person toileting, which includes using toliet, bedpan, or urinal?: None Help from another person bathing (including washing, rinsing, drying)?: A Little Help from another person to put on and taking off regular upper body clothing?: None Help from another person to put on and taking off regular lower body clothing?: A Little 6 Click Score: 22   End of Session Equipment Utilized During Treatment: Rolling walker  Activity Tolerance: Patient tolerated treatment well Patient left: in chair;with call bell/phone within reach;with family/visitor present  OT Visit Diagnosis: Other abnormalities of gait and mobility (R26.89);Pain Pain - Right/Left: Right Pain - part of body: Knee                 Time: 2353-6144 OT Time Calculation (min): 19 min Charges:  OT General Charges $OT Visit: 1 Visit OT Evaluation $OT Eval Low Complexity: 1 Low    Galen Manila 12/14/2020, 12:05 PM

## 2020-12-14 NOTE — Anesthesia Postprocedure Evaluation (Signed)
Anesthesia Post Note  Patient: Sally Hancock  Procedure(s) Performed: RIGHT TOTAL KNEE ARTHROPLASTY (Right Knee)     Patient location during evaluation: PACU Anesthesia Type: General Level of consciousness: awake and alert Pain management: pain level controlled Vital Signs Assessment: post-procedure vital signs reviewed and stable Respiratory status: spontaneous breathing, nonlabored ventilation, respiratory function stable and patient connected to nasal cannula oxygen Cardiovascular status: blood pressure returned to baseline and stable Postop Assessment: no apparent nausea or vomiting Anesthetic complications: no   No complications documented.  Last Vitals:  Vitals:   12/13/20 1942 12/14/20 0500  BP: 134/72 106/72  Pulse: 97 78  Resp: 18 18  Temp: 37.2 C 37.2 C  SpO2: 97% 99%    Last Pain:  Vitals:   12/14/20 0500  TempSrc: Oral  PainSc:                  [name]

## 2020-12-14 NOTE — Progress Notes (Signed)
Pt was given her AVS discharge summary and went over with her. IV was removed with catheter intact. Pt had no further questions. 3in1 was delivered for pt to take home with her.

## 2020-12-14 NOTE — Discharge Summary (Signed)
Discharge Diagnoses:  Active Problems:   Unilateral primary osteoarthritis, right knee   Arthritis of right knee   Surgeries: Procedure(s): RIGHT TOTAL KNEE ARTHROPLASTY on 12/13/2020    Consultants:   Discharged Condition: Improved  Hospital Course: DENEAN PAVON is an 45 y.o. female who was admitted 12/13/2020 with a chief complaint of osteoarthritis right knee, with a final diagnosis of Osteoarthritis Right Knee.  Patient was brought to the operating room on 12/13/2020 and underwent Procedure(s): RIGHT TOTAL KNEE ARTHROPLASTY.    Patient was given perioperative antibiotics:  Anti-infectives (From admission, onward)   Start     Dose/Rate Route Frequency Ordered Stop   12/14/20 0000  fluconazole (DIFLUCAN) 150 MG tablet        150 mg Oral  Once 12/14/20 0939 12/14/20 2359   12/13/20 1200  clindamycin (CLEOCIN) IVPB 600 mg        600 mg 100 mL/hr over 30 Minutes Intravenous Every 6 hours 12/13/20 1104 12/13/20 1816   12/13/20 0615  clindamycin (CLEOCIN) IVPB 900 mg  Status:  Discontinued        900 mg 100 mL/hr over 30 Minutes Intravenous On call to O.R. 12/13/20 0608 12/13/20 1050   12/13/20 0606  clindamycin (CLEOCIN) 900 MG/50ML IVPB  Status:  Discontinued       Note to Pharmacy: Reatha Armour   : cabinet override      12/13/20 0606 12/13/20 1046    .  Patient was given sequential compression devices, early ambulation, and aspirin for DVT prophylaxis.  Recent vital signs:  Patient Vitals for the past 24 hrs:  BP Temp Temp src Pulse Resp SpO2  12/14/20 0813 (!) 110/50 98.2 F (36.8 C) Oral 87 17 96 %  12/14/20 0500 106/72 98.9 F (37.2 C) Oral 78 18 99 %  12/13/20 1942 134/72 99 F (37.2 C) Axillary 97 18 97 %  12/13/20 1500 125/74 -- Oral 96 17 97 %  12/13/20 1200 130/77 98.7 F (37.1 C) -- 86 16 98 %  12/13/20 1055 (!) 149/91 98.2 F (36.8 C) Oral 82 18 96 %  12/13/20 1028 (!) 153/79 -- -- 93 (!) 21 97 %  12/13/20 1013 (!) 149/88 98 F (36.7 C) -- 85 (!) 21 98  %  12/13/20 1000 138/88 -- -- 88 17 92 %  12/13/20 0945 (!) 148/88 -- -- 89 17 100 %  .  Recent laboratory studies: No results found.  Discharge Medications:   Allergies as of 12/14/2020      Reactions   Penicillins Rash      Medication List    TAKE these medications   albuterol 108 (90 Base) MCG/ACT inhaler Commonly known as: VENTOLIN HFA Inhale 1-2 puffs into the lungs every 6 (six) hours as needed for wheezing or shortness of breath.   aspirin 325 MG EC tablet Take 1 tablet (325 mg total) by mouth daily with breakfast.   CRANBERRY PO Take 1 capsule by mouth daily.   fluconazole 150 MG tablet Commonly known as: DIFLUCAN Take 1 tablet (150 mg total) by mouth once for 1 dose.   omeprazole 20 MG capsule Commonly known as: PRILOSEC Take 20 mg by mouth daily.   oxyCODONE-acetaminophen 10-325 MG tablet Commonly known as: PERCOCET Take 1 tablet by mouth every 4 (four) hours as needed for pain.   prenatal multivitamin Tabs tablet Take 1 tablet by mouth daily at 12 noon.   TURMERIC PO Take 1 capsule by mouth daily.   Vitamin D 125 MCG (  5000 UT) Caps Take 5,000 Units by mouth daily.            Discharge Care Instructions  (From admission, onward)         Start     Ordered   12/14/20 0000  Weight bearing as tolerated       Question Answer Comment  Laterality bilateral   Extremity Lower      12/14/20 0939          Diagnostic Studies: No results found.  Patient benefited maximally from their hospital stay and there were no complications.     Disposition: Discharge disposition: 01-Home or Self Care      Discharge Instructions    Call MD / Call 911   Complete by: As directed    If you experience chest pain or shortness of breath, CALL 911 and be transported to the hospital emergency room.  If you develope a fever above 101 F, pus (white drainage) or increased drainage or redness at the wound, or calf pain, call your surgeon's office.    Constipation Prevention   Complete by: As directed    Drink plenty of fluids.  Prune juice may be helpful.  You may use a stool softener, such as Colace (over the counter) 100 mg twice a day.  Use MiraLax (over the counter) for constipation as needed.   Diet - low sodium heart healthy   Complete by: As directed    Do not put a pillow under the knee. Place it under the heel.   Complete by: As directed    Increase activity slowly as tolerated   Complete by: As directed    Post-operative opioid taper instructions:   Complete by: As directed    POST-OPERATIVE OPIOID TAPER INSTRUCTIONS: It is important to wean off of your opioid medication as soon as possible. If you do not need pain medication after your surgery it is ok to stop day one. Opioids include: Codeine, Hydrocodone(Norco, Vicodin), Oxycodone(Percocet, oxycontin) and hydromorphone amongst others.  Long term and even short term use of opiods can cause: Increased pain response Dependence Constipation Depression Respiratory depression And more.  Withdrawal symptoms can include Flu like symptoms Nausea, vomiting And more Techniques to manage these symptoms Hydrate well Eat regular healthy meals Stay active Use relaxation techniques(deep breathing, meditating, yoga) Do Not substitute Alcohol to help with tapering If you have been on opioids for less than two weeks and do not have pain than it is ok to stop all together.  Plan to wean off of opioids This plan should start within one week post op of your joint replacement. Maintain the same interval or time between taking each dose and first decrease the dose.  Cut the total daily intake of opioids by one tablet each day Next start to increase the time between doses. The last dose that should be eliminated is the evening dose.      Weight bearing as tolerated   Complete by: As directed    Laterality: bilateral   Extremity: Lower      Follow-up Information    Adonis Huguenin, NP In 1 week.   Specialty: Orthopedic Surgery Contact information: [address] [place] [address] [phone]                Signed: Nadara Mustard 12/14/2020, 9:40 AM

## 2020-12-14 NOTE — Progress Notes (Addendum)
Pt has been given Oxycodone, dilaudid and scheduled Toradol. Pt complains of uncontrolled pain. RN will continue to monitor pt.Oncall MD for Dr. Lajoyce Corners paged. Awaiting response.  [id number] MD called back.

## 2020-12-14 NOTE — Progress Notes (Signed)
Patient ID: Sally Hancock, female   DOB: 1976-05-13, 45 y.o.   MRN: [id number] Has essentially full extension to her right knee already the dressing is clean and dry.  She is ambulating independently and anticipate discharge to home today.  She requests Diflucan as well as Percocet 10.

## 2020-12-14 NOTE — Progress Notes (Signed)
Physical Therapy Treatment Patient Details Name: Sally Hancock MRN: [id number] DOB: 1975-10-16 Today's Date: 12/14/2020    History of Present Illness Pt is a 45 y/o female s/p R TKA on 4/29. PMH includes fibromyalgia and R hip fx surgery.    PT Comments    Pt is progressing well towards goals. Today's skilled session focused on hallway ambulation and HEP training to prepare for d/c home today. Current plan remains appropriate.    Follow Up Recommendations  Follow surgeon's recommendation for DC plan and follow-up therapies     Equipment Recommendations  3in1 (PT)    Recommendations for Other Services       Precautions / Restrictions Precautions Precautions: Knee Precaution Booklet Issued: Yes (comment) Precaution Comments: Reviewed knee precautions with pt. Restrictions Weight Bearing Restrictions: Yes RLE Weight Bearing: Weight bearing as tolerated    Mobility  Bed Mobility Overal bed mobility: Needs Assistance Bed Mobility: Supine to Sit     Supine to sit: Supervision     General bed mobility comments: supervision for safety.    Transfers Overall transfer level: Needs assistance Equipment used: Rolling walker (2 wheeled) Transfers: Sit to/from Stand Sit to Stand: Supervision         General transfer comment: pt able to rise from EOB with supervision for safety.  Ambulation/Gait Ambulation/Gait assistance: Min guard Gait Distance (Feet): 100 Feet (100x2) Assistive device: Rolling walker (2 wheeled) Gait Pattern/deviations: Step-to pattern;Step-through pattern;Decreased step length - left;Decreased stance time - right;Decreased stride length;Decreased dorsiflexion - right;Decreased weight shift to right;Antalgic;Wide base of support Gait velocity: decreased   General Gait Details: Pt with mildly antalgic gait. Cues for increased WB on the R and increased step length. Overall steady with RW. No overt LOB noted.   Stairs             Wheelchair  Mobility    Modified Rankin (Stroke Patients Only)       Balance Overall balance assessment: Needs assistance Sitting-balance support: No upper extremity supported;Feet supported Sitting balance-Leahy Scale: Fair     Standing balance support: Bilateral upper extremity supported;During functional activity Standing balance-Leahy Scale: Poor Standing balance comment: Reliant on BUE support                            Cognition Arousal/Alertness: Awake/alert Behavior During Therapy: WFL for tasks assessed/performed Overall Cognitive Status: Within Functional Limits for tasks assessed                                        Exercises Total Joint Exercises Short Arc QuadBarbaraann Boys;Right;10 reps;Seated Heel Slides: AROM;Right;10 reps;Seated Hip ABduction/ADduction: AAROM;Right;10 reps;Seated Straight Leg Raises: AAROM;Right;10 reps;Seated Long Arc Quad: AAROM;Right;10 reps;Seated Knee Flexion: AAROM;Right;5 reps;Seated (5 sec holds)    General Comments General comments (skin integrity, edema, etc.): Able to perform peri care w/o assist. Husband present and helpful      Pertinent Vitals/Pain Pain Assessment: 0-10 Pain Score: 5  Pain Location: R knee Pain Descriptors / Indicators: Aching;Operative site guarding Pain Intervention(s): Monitored during session;Limited activity within patient's tolerance;Repositioned;RN gave pain meds during session    Home Living                      Prior Function            PT Goals (current goals can now be found in  the care plan section) Acute Rehab PT Goals Patient Stated Goal: to decrease pain PT Goal Formulation: With patient Time For Goal Achievement: 12/27/20 Potential to Achieve Goals: Fair Progress towards PT goals: Progressing toward goals    Frequency    7X/week      PT Plan Current plan remains appropriate    Co-evaluation              AM-PAC PT "6 Clicks" Mobility    Outcome Measure  Help needed turning from your back to your side while in a flat bed without using bedrails?: A Little Help needed moving from lying on your back to sitting on the side of a flat bed without using bedrails?: A Little Help needed moving to and from a bed to a chair (including a wheelchair)?: A Little Help needed standing up from a chair using your arms (e.g., wheelchair or bedside chair)?: A Little Help needed to walk in hospital room?: A Little Help needed climbing 3-5 steps with a railing? : A Little 6 Click Score: 18    End of Session Equipment Utilized During Treatment: Gait belt Activity Tolerance: Patient tolerated treatment well Patient left: with call bell/phone within reach;with family/visitor present;in chair;Other (comment) (OT arrived to dovetail session) Nurse Communication: Mobility status PT Visit Diagnosis: Difficulty in walking, not elsewhere classified (R26.2);Pain Pain - Right/Left: Right Pain - part of body: Knee     Time: 0920-0959 PT Time Calculation (min) (ACUTE ONLY): 39 min  Charges:  $Gait Training: 8-22 mins $Therapeutic Exercise: 8-22 mins                     Kallie Locks, [place] Pager [phone] Acute Rehab   Sheral Apley 12/14/2020, 10:19 AM

## 2020-12-14 NOTE — TOC Transition Note (Signed)
Transition of Care [facility]) - CM/SW Discharge Note   Patient Details  Name: Sally Hancock MRN: [id number] Date of Birth: [birth year]  [facility] of [facility]) CM/SW Contact:  Kermit Balo, RN Phone Number: 12/14/2020, 10:25 AM   Clinical Narrative:    Patient is discharging home with self care. 3 in 1 for home to be delivered to the room per [employer].  Pt has supervision at home and transportation to home.   Final next level of care: Home/Self Care Barriers to Discharge: No Barriers Identified   Patient Goals and CMS Choice     Choice offered to / list presented to : Patient  Discharge Placement                       Discharge Plan and Services                DME Arranged: 3-N-1 DME Agency: [facility] Date DME Agency Contacted: 12/14/20   Representative spoke with at DME Agency: [place] [facility]            Social Determinants of Health (SDOH) Interventions     Readmission Risk Interventions No flowsheet data found.

## 2020-12-16 ENCOUNTER — Encounter (HOSPITAL_COMMUNITY): Payer: Self-pay | Admitting: Orthopedic Surgery

## 2020-12-18 ENCOUNTER — Other Ambulatory Visit: Payer: Self-pay | Admitting: Family

## 2020-12-18 ENCOUNTER — Other Ambulatory Visit (HOSPITAL_COMMUNITY): Payer: Self-pay

## 2020-12-18 MED ORDER — OXYCODONE-ACETAMINOPHEN 10-325 MG PO TABS
1.0000 | ORAL_TABLET | Freq: Four times a day (QID) | ORAL | 0 refills | Status: DC | PRN
  Filled 2020-12-18: qty 30, 8d supply, fill #0

## 2020-12-19 ENCOUNTER — Other Ambulatory Visit (HOSPITAL_COMMUNITY): Payer: Self-pay

## 2020-12-20 ENCOUNTER — Encounter: Payer: Self-pay | Admitting: Physician Assistant

## 2020-12-20 ENCOUNTER — Ambulatory Visit (INDEPENDENT_AMBULATORY_CARE_PROVIDER_SITE_OTHER): Payer: Self-pay | Admitting: Physician Assistant

## 2020-12-20 DIAGNOSIS — G8929 Other chronic pain: Secondary | ICD-10-CM

## 2020-12-20 DIAGNOSIS — M25561 Pain in right knee: Secondary | ICD-10-CM

## 2020-12-20 NOTE — Progress Notes (Signed)
Office Visit Note   Patient: Sally Hancock           Date of Birth: 21-Apr-1976           MRN: [id number] Visit Date: 12/20/2020              Requested by: Galvin Proffer, MD [address] [place],  [place] [address] PCP: Galvin Proffer, MD  Chief Complaint  Patient presents with  . Right Knee - Routine Post Op    12/13/2020 right total knee replacement       HPI: Patient is 1 week status post right total knee arthroplasty.  She is doing well she denies fever, chills, or calf pain.  She is been working with physical therapy reports range of motion at almost 90 degrees and a few degrees her to away from full extension  Assessment & Plan: Visit Diagnoses: No diagnosis found.  Plan: Continue with physical therapy follow-up in 1 week at which time x-rays of her knee should be obtained  Follow-Up Instructions: No follow-ups on file.   Ortho Exam  Patient is alert, oriented, no adenopathy, well-dressed, normal affect, normal respiratory effort. Right knee healing surgical incision compartments are soft and nontender and negative Homans' sign.  No ascending cellulitis.  Calves are soft.  Minimal drainage from incision no signs of infection  Imaging: No results found. No images are attached to the encounter.  Labs: No results found for: HGBA1C, ESRSEDRATE, CRP, LABURIC, REPTSTATUS, GRAMSTAIN, CULT, LABORGA   No results found for: ALBUMIN, PREALBUMIN, CBC  No results found for: MG No results found for: VD25OH  No results found for: PREALBUMIN CBC EXTENDED Latest Ref Rng & Units 12/10/2020  WBC 4.0 - 10.5 K/uL 10.2  RBC 3.87 - 5.11 MIL/uL 4.94  HGB 12.0 - 15.0 g/dL 03.5  HCT 59.7 - 41.6 % 45.7  PLT 150 - 400 K/uL 296     There is no height or weight on file to calculate BMI.  Orders:  No orders of the defined types were placed in this encounter.  No orders of the defined types were placed in this encounter.    Procedures: No procedures performed  Clinical  Data: No additional findings.  ROS:  All other systems negative, except as noted in the HPI. Review of Systems  Objective: Vital Signs: LMP 11/25/2020   Specialty Comments:  No specialty comments available.  PMFS History: Patient Active Problem List   Diagnosis Date Noted  . Arthritis of right knee 12/13/2020  . Unilateral primary osteoarthritis, right knee    Past Medical History:  Diagnosis Date  . Arthritis   . Chronic bronchitis (HCC)   . Fibromyalgia   . GERD (gastroesophageal reflux disease)   . Headache    migraines  . History of hiatal hernia   . Trauma   . Vaginal Pap smear, abnormal     Family History  Problem Relation Age of Onset  . Breast cancer Maternal Aunt     Past Surgical History:  Procedure Laterality Date  . CESAREAN SECTION    . CHOLECYSTECTOMY  2012  . FRACTURE SURGERY Right 08/2015   acetabulum fx  . HIP SURGERY    . KNEE SURGERY    . TONSILLECTOMY     @ 45 years old  . TOTAL KNEE ARTHROPLASTY Right 12/13/2020   Procedure: RIGHT TOTAL KNEE ARTHROPLASTY;  Surgeon: Nadara Mustard, MD;  Location: [facility] OR;  Service: Orthopedics;  Laterality: Right;   Social  History   Occupational History  . Not on file  Tobacco Use  . Smoking status: Current Every Day Smoker    Packs/day: 0.50    Years: 5.00    Pack years: 2.50    Types: Cigarettes  . Smokeless tobacco: Never Used  Vaping Use  . Vaping Use: Some days  Substance and Sexual Activity  . Alcohol use: Never  . Drug use: Never  . Sexual activity: Not Currently    Birth control/protection: I.U.D.

## 2020-12-27 ENCOUNTER — Encounter: Payer: Self-pay | Admitting: Physician Assistant

## 2020-12-27 ENCOUNTER — Ambulatory Visit (INDEPENDENT_AMBULATORY_CARE_PROVIDER_SITE_OTHER): Payer: Self-pay | Admitting: Physician Assistant

## 2020-12-27 ENCOUNTER — Ambulatory Visit: Payer: Self-pay

## 2020-12-27 DIAGNOSIS — Z96651 Presence of right artificial knee joint: Secondary | ICD-10-CM

## 2020-12-27 MED ORDER — OXYCODONE-ACETAMINOPHEN 10-325 MG PO TABS: 1.0000 | ORAL_TABLET | Freq: Four times a day (QID) | ORAL | 0 refills | Status: DC | PRN

## 2020-12-27 NOTE — Progress Notes (Signed)
Office Visit Note   Patient: Sally Hancock           Date of Birth: 07-03-1976           MRN: [id number] Visit Date: 12/27/2020              Requested by: Galvin Proffer, MD [address] [place],  [place] [address] PCP: Galvin Proffer, MD  Chief Complaint  Patient presents with  . Right Knee - Routine Post Op      HPI: Patient is 2 weeks status post right total knee arthroplasty.  She just finished home physical therapy and is requesting outpatient therapy.  She denies fever, chills, or calf pain.  She has had some difficulty with dizzy spells in the last week since her last visit.  This is usually been when she is standing up.  She denies any chest pain she denies any shortness of breath.  She is taking her aspirin  Assessment & Plan: Visit Diagnoses:  1. Status post right knee replacement     Plan: I discussed patient about changing positions slower to see if this helps.  She did have her blood pressure today taken which was 113/76.  She said this is actually better than it was yesterday.  She is going to contact her primary care provider  Follow-Up Instructions: No follow-ups on file.   Ortho Exam  Patient is alert, oriented, no adenopathy, well-dressed, normal affect, normal respiratory effort. Examination well apposed wound incision incision is healed surgical staples in place compartments are soft and nontender she has no cellulitis no evidence of bleeding she has good plantar flexion and dorsiflexion.  Negative Denna Haggard' sign  Imaging: No results found. No images are attached to the encounter.  Labs: No results found for: HGBA1C, ESRSEDRATE, CRP, LABURIC, REPTSTATUS, GRAMSTAIN, CULT, LABORGA   No results found for: ALBUMIN, PREALBUMIN, CBC  No results found for: MG No results found for: VD25OH  No results found for: PREALBUMIN CBC EXTENDED Latest Ref Rng & Units 12/10/2020  WBC 4.0 - 10.5 K/uL 10.2  RBC 3.87 - 5.11 MIL/uL 4.94  HGB 12.0 - 15.0 g/dL 29.5   HCT 18.8 - 41.6 % 45.7  PLT 150 - 400 K/uL 296     There is no height or weight on file to calculate BMI.  Orders:  Orders Placed This Encounter  Procedures  . XR Knee 1-2 Views Right   No orders of the defined types were placed in this encounter.    Procedures: No procedures performed  Clinical Data: No additional findings.  ROS:  All other systems negative, except as noted in the HPI. Review of Systems  Objective: Vital Signs: There were no vitals taken for this visit.  Specialty Comments:  No specialty comments available.  PMFS History: Patient Active Problem List   Diagnosis Date Noted  . Arthritis of right knee 12/13/2020  . Unilateral primary osteoarthritis, right knee    Past Medical History:  Diagnosis Date  . Arthritis   . Chronic bronchitis (HCC)   . Fibromyalgia   . GERD (gastroesophageal reflux disease)   . Headache    migraines  . History of hiatal hernia   . Trauma   . Vaginal Pap smear, abnormal     Family History  Problem Relation Age of Onset  . Breast cancer Maternal Aunt     Past Surgical History:  Procedure Laterality Date  . CESAREAN SECTION    . CHOLECYSTECTOMY  2012  .  FRACTURE SURGERY Right 08/2015   acetabulum fx  . HIP SURGERY    . KNEE SURGERY    . TONSILLECTOMY     @ 45 years old  . TOTAL KNEE ARTHROPLASTY Right 12/13/2020   Procedure: RIGHT TOTAL KNEE ARTHROPLASTY;  Surgeon: Nadara Mustard, MD;  Location: [facility] OR;  Service: Orthopedics;  Laterality: Right;   Social History   Occupational History  . Not on file  Tobacco Use  . Smoking status: Current Every Day Smoker    Packs/day: 0.50    Years: 5.00    Pack years: 2.50    Types: Cigarettes  . Smokeless tobacco: Never Used  Vaping Use  . Vaping Use: Some days  Substance and Sexual Activity  . Alcohol use: Never  . Drug use: Never  . Sexual activity: Not Currently    Birth control/protection: I.U.D.

## 2020-12-31 ENCOUNTER — Other Ambulatory Visit: Payer: Self-pay

## 2020-12-31 ENCOUNTER — Ambulatory Visit (INDEPENDENT_AMBULATORY_CARE_PROVIDER_SITE_OTHER): Payer: Self-pay | Admitting: Physical Therapy

## 2020-12-31 ENCOUNTER — Encounter: Payer: Self-pay | Admitting: Physical Therapy

## 2020-12-31 DIAGNOSIS — R600 Localized edema: Secondary | ICD-10-CM

## 2020-12-31 DIAGNOSIS — M6281 Muscle weakness (generalized): Secondary | ICD-10-CM

## 2020-12-31 DIAGNOSIS — R2689 Other abnormalities of gait and mobility: Secondary | ICD-10-CM

## 2020-12-31 DIAGNOSIS — R42 Dizziness and giddiness: Secondary | ICD-10-CM

## 2020-12-31 DIAGNOSIS — M25661 Stiffness of right knee, not elsewhere classified: Secondary | ICD-10-CM

## 2020-12-31 DIAGNOSIS — M25561 Pain in right knee: Secondary | ICD-10-CM

## 2020-12-31 DIAGNOSIS — R2681 Unsteadiness on feet: Secondary | ICD-10-CM

## 2020-12-31 NOTE — Patient Instructions (Signed)
Access Code: [email or web address] URL: [email or web address] Date: 12/31/2020 Prepared by: Moshe Cipro  Exercises Supine Quad Set - 4-5 x daily - 7 x weekly - 1-2 sets - 10 reps - 5 sec hold Supine Short Arc Quad - 4-5 x daily - 7 x weekly - 1-2 sets - 10 reps - 5 sec hold Seated Knee Flexion AAROM - 4-5 x daily - 7 x weekly - 1-2 sets - 10 reps - 10 sec hold Supine Heel Slide with Strap - 4-5 x daily - 7 x weekly - 1-2 sets - 10 reps - 5 sec hold Seated Long Arc Quad - 4-5 x daily - 7 x weekly - 1-2 sets - 10 reps - 5 sec hold

## 2020-12-31 NOTE — Therapy (Signed)
[facility] [address] [place], [place], [address] Phone: [phone]   Fax:  [phone]  Physical Therapy Evaluation  Patient Details  Name: Sally Hancock MRN: [id number] Date of Birth: 06/30/1976 Referring Provider (PT): Persons, [place], [place]   Encounter Date: 12/31/2020   PT End of Session - 12/31/20 1405    Visit Number 1    Number of Visits 24    Date for PT Re-Evaluation 02/25/21    PT Start Time 1300    PT Stop Time 1340    PT Time Calculation (min) 40 min    Activity Tolerance Patient tolerated treatment well    Behavior During Therapy [facility] for tasks assessed/performed           Past Medical History:  Diagnosis Date  . Arthritis   . Chronic bronchitis (HCC)   . Fibromyalgia   . GERD (gastroesophageal reflux disease)   . Headache    migraines  . History of hiatal hernia   . Trauma   . Vaginal Pap smear, abnormal     Past Surgical History:  Procedure Laterality Date  . CESAREAN SECTION    . CHOLECYSTECTOMY  2012  . FRACTURE SURGERY Right 08/2015   acetabulum fx  . HIP SURGERY    . KNEE SURGERY    . TONSILLECTOMY     @ 45 years old  . TOTAL KNEE ARTHROPLASTY Right 12/13/2020   Procedure: RIGHT TOTAL KNEE ARTHROPLASTY;  Surgeon: Nadara Mustarduda, Marcus V, MD;  [facility];  Service: Orthopedics;  Laterality: Right;    There were no vitals filed for this visit.    Subjective Assessment - 12/31/20 1302    Subjective Pt is a 45 y/o female who presents to OPPT s/p Rt TKA on 12/13/20, with HHPT for 2 wks following surgery.  She is amb with RW today.  She was initially scheduled and due to COVID her surgery was scheduled, then work schedule did not allow her to have surgery until now.    Pertinent History arthritis, fibromyalgia    Limitations Standing;Walking    Patient Stated Goals regain mobility, improve motion in knee, walk better    Currently in Pain? Yes    Pain Score 3    up to 10/10; at best 3/10   Pain Location  Knee    Pain Orientation Right    Pain Descriptors / Indicators Aching;Dull;Throbbing    Pain Type Acute pain;Surgical pain    Pain Onset 1 to 4 weeks ago    Pain Frequency Constant    Aggravating Factors  stretching into extension, bending    Pain Relieving Factors meds              [facility] - 12/31/20 1257      Assessment   Medical Diagnosis Z96.651 (ICD-10-CM) - Status post right knee replacement    Referring Provider (PT) Persons, [place], [place]    Onset Date/Surgical Date 12/13/20    Hand Dominance Right    Next MD Visit 01/24/21    Prior Therapy 2wks of HHPT      Precautions   Precautions None      Restrictions   Weight Bearing Restrictions No      Balance Screen   Has the patient fallen in the past 6 months Yes    How many times? 2 - slipped on water; then fall with PT last week (pushed back onto couch)    Has the patient had a decrease in activity  level because of a fear of falling?  Yes    Is the patient reluctant to leave their home because of a fear of falling?  No      Home Environment   Living Environment Private residence    Living Arrangements Spouse/significant other;Children   8 and 95 y/o sons   Type of Home House    Home Access Ramped entrance    Home Layout One level    Home Equipment Walker - 2 wheels;Cane - single point    Additional Comments supervision for ADLs      Prior Function   Level of Independence Independent    Vocation Full time employment    Freight forwarder - MedSurg at [employer]; gardening; photography; fish tanks; no regular exercise      Cognition   Overall Cognitive Status Within Functional Limits for tasks assessed      Observation/Other Assessments   Observations increased swelling noted Rt knee with healing incision    Focus on Therapeutic Outcomes (FOTO)  40 (predicted 53)      ROM / Strength   AROM / PROM / Strength AROM;PROM;Strength      AROM   AROM Assessment Site Knee     Right/Left Knee Right;Left    Right Knee Extension -17   supine   Right Knee Flexion 83    Left Knee Extension 4    Left Knee Flexion 121      PROM   PROM Assessment Site Knee    Right/Left Knee Right    Right Knee Extension -12    Right Knee Flexion 95      Strength   Overall Strength Comments Rt knee 3-/5; extensor lag noted with SLR and difficulty isolating quad for quad set      Ambulation/Gait   Ambulation/Gait Yes    Ambulation/Gait Assistance 5: Supervision    Ambulation/Gait Assistance Details min cues needed for sequencing    Ambulation Distance (Feet) 100 Feet    Assistive device Rolling walker    Gait Pattern Step-to pattern;Decreased stance time - right;Decreased step length - left;Decreased hip/knee flexion - right;Antalgic                      Objective measurements completed on examination: See above findings.       [facility] Adult PT Treatment/Exercise - 12/31/20 1257      Exercises   Exercises Other Exercises    Other Exercises  see pt instructions - pt performed 1-5 reps of each exercise with min cues for technique                  PT Education - 12/31/20 1405    Education Details HEP    Person(s) Educated Patient    Methods Explanation;Handout    Comprehension Verbalized understanding;Returned demonstration;Need further instruction            PT Short Term Goals - 12/31/20 1413      PT SHORT TERM GOAL #1   Title Independent with initial HEP    Time 4    Period Weeks    Status New    Target Date 01/28/21      PT SHORT TERM GOAL #2   Title Rt knee AROM improved -8-95 for improved function    Time 4    Period Weeks    Status New    Target Date 01/28/21  PT Long Term Goals - 12/31/20 1413      PT LONG TERM GOAL #1   Title Independent with final HEP    Time 8    Period Weeks    Status New    Target Date 02/25/21      PT LONG TERM GOAL #2   Title Rt knee AROM improved -5-105 for improved function     Time 8    Period Weeks    Status New    Target Date 02/25/21      PT LONG TERM GOAL #3   Title Amb independently without significant deviations for improved function    Time 8    Period Weeks    Status New    Target Date 02/25/21      PT LONG TERM GOAL #4   Title Report pain < 2/10 for improved function    Time 8    Period Weeks    Status New    Target Date 02/25/21      PT LONG TERM GOAL #5   Title FOTO score improved to 53 for improved function    Time 8    Period Weeks    Status New    Target Date 02/25/21                  Plan - 12/31/20 1406    Clinical Impression Statement Pt is a 45 y/o female who presents to OPPT s/p Rt TKA on 12/13/20.  She demonstrates decreased strength, ROM, increased edema and pain with gait abnormalities affecting functional mobility.  She also expresses difficulty with dizziness and may benefit from additional vestibular assessment to help with balance and fall risk.  Pt will benefit from PT to address deficits listed.    Personal Factors and Comorbidities Comorbidity 2    Comorbidities arthritis, fibromyalgia    Examination-Activity Limitations Sit;Bathing;Sleep;Squat;Stairs;Stand;Dressing;Transfers;Locomotion Level    Examination-Participation Restrictions Community Activity;Shop;Driving;Occupation;Meal Prep;Yard Work    Conservation officer, historic buildings Evolving/Moderate complexity    Clinical Decision Making Moderate    Rehab Potential Good    PT Frequency 3x / week   2-3x/wk   PT Duration 8 weeks    PT Treatment/Interventions ADLs/Self Care Home Management;Aquatic Therapy;Canalith Repostioning;Cryotherapy;Electrical Stimulation;Moist Heat;Balance training;Therapeutic exercise;Therapeutic activities;Functional mobility training;Stair training;Gait training;DME Instruction;Neuromuscular re-education;Patient/family education;Manual techniques;Vestibular;Vasopneumatic Device;Taping;Dry needling;Passive range of motion    PT Next  Visit Plan review HEP, needs aggressive ROM (extension focus), gait with cane    PT Home Exercise Plan Access Code: [email or web address]    Consulted and Agree with Plan of Care Patient           Patient will benefit from skilled therapeutic intervention in order to improve the following deficits and impairments:  Abnormal gait,Increased edema,Decreased scar mobility,Decreased knowledge of use of DME,Decreased activity tolerance,Decreased mobility,Difficulty walking,Decreased balance,Decreased strength,Pain,Decreased range of motion,Dizziness  Visit Diagnosis: Acute pain of right knee - Plan: PT plan of care cert/re-cert  Stiffness of right knee, not elsewhere classified - Plan: PT plan of care cert/re-cert  Other abnormalities of gait and mobility - Plan: PT plan of care cert/re-cert  Muscle weakness (generalized) - Plan: PT plan of care cert/re-cert  Localized edema - Plan: PT plan of care cert/re-cert  Dizziness and giddiness - Plan: PT plan of care cert/re-cert  Unsteadiness on feet - Plan: PT plan of care cert/re-cert     Problem List Patient Active Problem List   Diagnosis Date Noted  . Arthritis of right knee 12/13/2020  . Unilateral primary osteoarthritis, right  knee       Clarita Crane, PT, DPT 12/31/20 2:20 PM    [facility] Physical Therapy [address] [place], [place], [address] Phone: [phone]   Fax:  [phone]  Name: Sally Hancock MRN: [id number] Date of Birth: 11/02/1975

## 2021-01-01 ENCOUNTER — Telehealth: Payer: Self-pay | Admitting: Family

## 2021-01-01 NOTE — Telephone Encounter (Signed)
Patient's husband Onalee Hua submitted medical release form, short term disability forms Earnestine Mealing has from [employer], and $25.00 cash payment to [employer]. Accepted 01/01/21. Earnestine Mealing verified to have short term disability forms.

## 2021-01-02 ENCOUNTER — Ambulatory Visit (INDEPENDENT_AMBULATORY_CARE_PROVIDER_SITE_OTHER): Payer: No Typology Code available for payment source | Admitting: Physical Therapy

## 2021-01-02 ENCOUNTER — Other Ambulatory Visit: Payer: Self-pay

## 2021-01-02 DIAGNOSIS — M25661 Stiffness of right knee, not elsewhere classified: Secondary | ICD-10-CM

## 2021-01-02 DIAGNOSIS — M25561 Pain in right knee: Secondary | ICD-10-CM

## 2021-01-02 DIAGNOSIS — M6281 Muscle weakness (generalized): Secondary | ICD-10-CM

## 2021-01-02 DIAGNOSIS — R2689 Other abnormalities of gait and mobility: Secondary | ICD-10-CM

## 2021-01-02 DIAGNOSIS — R600 Localized edema: Secondary | ICD-10-CM

## 2021-01-02 NOTE — Therapy (Signed)
[facility] Physical Therapy [address] [place], [place], [address] Phone: [phone]   Fax:  [phone]  Physical Therapy Treatment  Patient Details  Name: Sally Hancock MRN: [id number] Date of Birth: 1976/02/09 Referring Provider (PT): Persons, [place], [place]   Encounter Date: 01/02/2021   PT End of Session - 01/02/21 1139    Visit Number 2    Number of Visits 24    Date for PT Re-Evaluation 02/25/21    PT Start Time 1050    PT Stop Time 1140    PT Time Calculation (min) 50 min    Activity Tolerance Patient tolerated treatment well    Behavior During Therapy [facility] for tasks assessed/performed           Past Medical History:  Diagnosis Date  . Arthritis   . Chronic bronchitis (HCC)   . Fibromyalgia   . GERD (gastroesophageal reflux disease)   . Headache    migraines  . History of hiatal hernia   . Trauma   . Vaginal Pap smear, abnormal     Past Surgical History:  Procedure Laterality Date  . CESAREAN SECTION    . CHOLECYSTECTOMY  2012  . FRACTURE SURGERY Right 08/2015   acetabulum fx  . HIP SURGERY    . KNEE SURGERY    . TONSILLECTOMY     @ 45 years old  . TOTAL KNEE ARTHROPLASTY Right 12/13/2020   Procedure: RIGHT TOTAL KNEE ARTHROPLASTY;  Surgeon: Nadara Mustard, MD;  Location: [facility] OR;  Service: Orthopedics;  Laterality: Right;    There were no vitals filed for this visit.   Subjective Assessment - 01/02/21 1057    Subjective She relays about 5/10 pain overall that is not really in her Rt knee but in her Rt hip. She also is having difficulty sleeping. She wants to get off the RW ASAP as it is hurthing her Rt shoulder.    Pertinent History arthritis, fibromyalgia    Limitations Standing;Walking    Patient Stated Goals regain mobility, improve motion in knee, walk better    Pain Onset 1 to 4 weeks ago             [facility] Adult PT Treatment/Exercise - 01/02/21 0001      Ambulation/Gait   Ambulation/Gait Yes    Ambulation/Gait  Assistance 5: Supervision    Ambulation Distance (Feet) 100 Feet   with RW, then 100 feet with SPC   Assistive device Straight cane;Rolling walker    Gait Pattern Step-to pattern;Decreased stance time - right;Decreased step length - left;Decreased hip/knee flexion - right;Antalgic      Exercises   Exercises Knee/Hip      Knee/Hip Exercises: Stretches   Active Hamstring Stretch Right;3 reps;30 seconds    Active Hamstring Stretch Limitations at leg press    Hip Flexor Stretch Limitations seated tailgate knee flexion heelsides self O.P 10 sec X 10    Gastroc Stretch Right;3 reps;30 seconds    Gastroc Stretch Limitations seated with strap    Other Knee/Hip Stretches supine heel prop for 5 min with 3# on knee and foot tied to other foot to keep her out of ER      Knee/Hip Exercises: Aerobic   Nustep L5 X8 min with 30 sec intervals of faster pace, then one minute interval of slower pace      Knee/Hip Exercises: Machines for Strengthening   Total Gym Leg Press 75#bilat 3X10 then 37# Rt leg 2X10 (holding 2 sec at end ROM)  Manual Therapy   Manual therapy comments Rt knee PROM, patella mobs and extension mobs. Manual hamstring and gastroc stretching. Long axis distraction                    PT Short Term Goals - 12/31/20 1413      PT SHORT TERM GOAL #1   Title Independent with initial HEP    Time 4    Period Weeks    Status New    Target Date 01/28/21      PT SHORT TERM GOAL #2   Title Rt knee AROM improved -8-95 for improved function    Time 4    Period Weeks    Status New    Target Date 01/28/21             PT Long Term Goals - 12/31/20 1413      PT LONG TERM GOAL #1   Title Independent with final HEP    Time 8    Period Weeks    Status New    Target Date 02/25/21      PT LONG TERM GOAL #2   Title Rt knee AROM improved -5-105 for improved function    Time 8    Period Weeks    Status New    Target Date 02/25/21      PT LONG TERM GOAL #3   Title  Amb independently without significant deviations for improved function    Time 8    Period Weeks    Status New    Target Date 02/25/21      PT LONG TERM GOAL #4   Title Report pain < 2/10 for improved function    Time 8    Period Weeks    Status New    Target Date 02/25/21      PT LONG TERM GOAL #5   Title FOTO score improved to 53 for improved function    Time 8    Period Weeks    Status New    Target Date 02/25/21                 Plan - 01/02/21 1144    Clinical Impression Statement Session focused on Rt knee ROM and gait with progression from RW to [facility]. She does still lack signiifcant knee extension ROM so we will focus on this.    Personal Factors and Comorbidities Comorbidity 2    Comorbidities arthritis, fibromyalgia    Examination-Activity Limitations Sit;Bathing;Sleep;Squat;Stairs;Stand;Dressing;Transfers;Locomotion Level    Examination-Participation Restrictions Community Activity;Shop;Driving;Occupation;Meal Prep;Yard Work    Conservation officer, historic buildings Evolving/Moderate complexity    Rehab Potential Good    PT Frequency 3x / week   2-3x/wk   PT Duration 8 weeks    PT Treatment/Interventions ADLs/Self Care Home Management;Aquatic Therapy;Canalith Repostioning;Cryotherapy;Electrical Stimulation;Moist Heat;Balance training;Therapeutic exercise;Therapeutic activities;Functional mobility training;Stair training;Gait training;DME Instruction;Neuromuscular re-education;Patient/family education;Manual techniques;Vestibular;Vasopneumatic Device;Taping;Dry needling;Passive range of motion    PT Next Visit Plan review HEP, needs aggressive ROM (extension focus), gait with cane    PT Home Exercise Plan Access Code: [email or web address]    Consulted and Agree with Plan of Care Patient           Patient will benefit from skilled therapeutic intervention in order to improve the following deficits and impairments:  Abnormal gait,Increased edema,Decreased scar mobility,Decreased  knowledge of use of DME,Decreased activity tolerance,Decreased mobility,Difficulty walking,Decreased balance,Decreased strength,Pain,Decreased range of motion,Dizziness  Visit Diagnosis: Acute pain of right knee  Stiffness of right knee, not elsewhere classified  Other abnormalities of gait and mobility  Muscle weakness (generalized)  Localized edema     Problem List Patient Active Problem List   Diagnosis Date Noted  . Arthritis of right knee 12/13/2020  . Unilateral primary osteoarthritis, right knee     Birdie Riddle 01/02/2021, 11:45 AM  [facility] Physical Therapy [address] [place], [place], [address] Phone: [phone]   Fax:  [phone]  Name: SAKEENAH VALCARCEL MRN: [id number] Date of Birth: 04-15-1976

## 2021-01-06 ENCOUNTER — Other Ambulatory Visit: Payer: Self-pay

## 2021-01-06 ENCOUNTER — Other Ambulatory Visit: Payer: Self-pay | Admitting: Physician Assistant

## 2021-01-06 ENCOUNTER — Ambulatory Visit (INDEPENDENT_AMBULATORY_CARE_PROVIDER_SITE_OTHER): Payer: No Typology Code available for payment source | Admitting: Rehabilitative and Restorative Service Providers"

## 2021-01-06 ENCOUNTER — Encounter: Payer: Self-pay | Admitting: Rehabilitative and Restorative Service Providers"

## 2021-01-06 DIAGNOSIS — M6281 Muscle weakness (generalized): Secondary | ICD-10-CM

## 2021-01-06 DIAGNOSIS — R2689 Other abnormalities of gait and mobility: Secondary | ICD-10-CM

## 2021-01-06 DIAGNOSIS — R600 Localized edema: Secondary | ICD-10-CM

## 2021-01-06 DIAGNOSIS — M25661 Stiffness of right knee, not elsewhere classified: Secondary | ICD-10-CM | POA: Diagnosis not present

## 2021-01-06 DIAGNOSIS — M25561 Pain in right knee: Secondary | ICD-10-CM | POA: Diagnosis not present

## 2021-01-06 MED ORDER — OXYCODONE-ACETAMINOPHEN 10-325 MG PO TABS: 1.0000 | ORAL_TABLET | Freq: Four times a day (QID) | ORAL | 0 refills | Status: DC | PRN

## 2021-01-06 NOTE — Therapy (Signed)
[facility] Physical Therapy [address] [place], [place], [address] Phone: [phone]   Fax:  [phone]  Physical Therapy Treatment  Patient Details  Name: Sally Hancock MRN: [id number] Date of Birth: Apr 25, 1976 Referring Provider (PT): Persons, [place], [place]   Encounter Date: 01/06/2021   PT End of Session - 01/06/21 1020    Visit Number 3    Number of Visits 24    Date for PT Re-Evaluation 02/25/21    PT Start Time 1014    PT Stop Time 1054    PT Time Calculation (min) 40 min    Activity Tolerance Patient tolerated treatment well    Behavior During Therapy [facility] for tasks assessed/performed           Past Medical History:  Diagnosis Date  . Arthritis   . Chronic bronchitis (HCC)   . Fibromyalgia   . GERD (gastroesophageal reflux disease)   . Headache    migraines  . History of hiatal hernia   . Trauma   . Vaginal Pap smear, abnormal     Past Surgical History:  Procedure Laterality Date  . CESAREAN SECTION    . CHOLECYSTECTOMY  2012  . FRACTURE SURGERY Right 08/2015   acetabulum fx  . HIP SURGERY    . KNEE SURGERY    . TONSILLECTOMY     @ 45 years old  . TOTAL KNEE ARTHROPLASTY Right 12/13/2020   Procedure: RIGHT TOTAL KNEE ARTHROPLASTY;  Surgeon: Nadara Mustard, MD;  Location: [facility] OR;  Service: Orthopedics;  Laterality: Right;    There were no vitals filed for this visit.   Subjective Assessment - 01/06/21 1019    Subjective Pt. indicated 6/10 pain noted upon arivall that sometimes can got up.  Pt. stated feeling trouble sleeping at night.    Pertinent History arthritis, fibromyalgia    Limitations Standing;Walking    Patient Stated Goals regain mobility, improve motion in knee, walk better    Currently in Pain? Yes    Pain Score 6     Pain Location Knee    Pain Orientation Right    Pain Descriptors / Indicators Aching;Sore;Throbbing;Tightness    Pain Type Surgical pain;Acute pain    Pain Onset 1 to 4 weeks ago    Pain  Frequency Constant    Aggravating Factors  extension, walking    Pain Relieving Factors medicine                             [facility] Adult PT Treatment/Exercise - 01/06/21 0001      Neuro Re-ed    Neuro Re-ed Details  retro step Rt LE loading posterior x 20      Exercises   Other Exercises  Continued education and cues for extension stretch in supine/sitting      Knee/Hip Exercises: Stretches   Active Hamstring Stretch 30 seconds;3 reps;Right   in leg press incline position c quad set hold   Gastroc Stretch 30 seconds;3 reps;Right   runner stretch on incline board     Knee/Hip Exercises: Aerobic   Nustep Lvl 5 6 mins      Knee/Hip Exercises: Machines for Strengthening   Total Gym Leg Press 37 lbs 2 x 15 full range      Knee/Hip Exercises: Seated   Other Seated Knee/Hip Exercises quad set to slr x 10 Rt      Manual Therapy   Manual therapy comments seated Rt knee distraction,  IR and flexion mobilization c movement                    PT Short Term Goals - 12/31/20 1413      PT SHORT TERM GOAL #1   Title Independent with initial HEP    Time 4    Period Weeks    Status New    Target Date 01/28/21      PT SHORT TERM GOAL #2   Title Rt knee AROM improved -8-95 for improved function    Time 4    Period Weeks    Status New    Target Date 01/28/21             PT Long Term Goals - 12/31/20 1413      PT LONG TERM GOAL #1   Title Independent with final HEP    Time 8    Period Weeks    Status New    Target Date 02/25/21      PT LONG TERM GOAL #2   Title Rt knee AROM improved -5-105 for improved function    Time 8    Period Weeks    Status New    Target Date 02/25/21      PT LONG TERM GOAL #3   Title Amb independently without significant deviations for improved function    Time 8    Period Weeks    Status New    Target Date 02/25/21      PT LONG TERM GOAL #4   Title Report pain < 2/10 for improved function    Time 8    Period  Weeks    Status New    Target Date 02/25/21      PT LONG TERM GOAL #5   Title FOTO score improved to 53 for improved function    Time 8    Period Weeks    Status New    Target Date 02/25/21                 Plan - 01/06/21 1049    Clinical Impression Statement Flexion mobility gaining easier with improved quality compard to extension, both active and passively.  Continued cues for TKE stretching in heel prop as well as quad set/slr usage in sitting.    Personal Factors and Comorbidities Comorbidity 2    Comorbidities arthritis, fibromyalgia    Examination-Activity Limitations Sit;Bathing;Sleep;Squat;Stairs;Stand;Dressing;Transfers;Locomotion Level    Examination-Participation Restrictions Community Activity;Shop;Driving;Occupation;Meal Prep;Yard Work    Conservation officer, historic buildings Evolving/Moderate complexity    Rehab Potential Good    PT Frequency 3x / week   2-3x/wk   PT Duration 8 weeks    PT Treatment/Interventions ADLs/Self Care Home Management;Aquatic Therapy;Canalith Repostioning;Cryotherapy;Electrical Stimulation;Moist Heat;Balance training;Therapeutic exercise;Therapeutic activities;Functional mobility training;Stair training;Gait training;DME Instruction;Neuromuscular re-education;Patient/family education;Manual techniques;Vestibular;Vasopneumatic Device;Taping;Dry needling;Passive range of motion    PT Next Visit Plan Continued extension training in passive and active movement, static balance intervention.    PT Home Exercise Plan Access Code: [email or web address]    Consulted and Agree with Plan of Care Patient           Patient will benefit from skilled therapeutic intervention in order to improve the following deficits and impairments:  Abnormal gait,Increased edema,Decreased scar mobility,Decreased knowledge of use of DME,Decreased activity tolerance,Decreased mobility,Difficulty walking,Decreased balance,Decreased strength,Pain,Decreased range of  motion,Dizziness  Visit Diagnosis: Acute pain of right knee  Stiffness of right knee, not elsewhere classified  Other abnormalities of gait and mobility  Muscle weakness (generalized)  Localized  edema     Problem List Patient Active Problem List   Diagnosis Date Noted  . Arthritis of right knee 12/13/2020  . Unilateral primary osteoarthritis, right knee     Chyrel Masson, PT, DPT, OCS, ATC 01/06/21  10:53 AM    [facility] Physical Therapy [address] [place], [place], [address] Phone: [phone]   Fax:  [phone]  Name: MANYA BALASH MRN: [id number] Date of Birth: 05-Mar-1976

## 2021-01-08 ENCOUNTER — Other Ambulatory Visit: Payer: Self-pay

## 2021-01-08 ENCOUNTER — Encounter: Payer: Self-pay | Admitting: Family

## 2021-01-08 ENCOUNTER — Ambulatory Visit (INDEPENDENT_AMBULATORY_CARE_PROVIDER_SITE_OTHER): Payer: No Typology Code available for payment source | Admitting: Physical Therapy

## 2021-01-08 ENCOUNTER — Encounter: Payer: Self-pay | Admitting: Physical Therapy

## 2021-01-08 ENCOUNTER — Ambulatory Visit (INDEPENDENT_AMBULATORY_CARE_PROVIDER_SITE_OTHER): Payer: No Typology Code available for payment source | Admitting: Family

## 2021-01-08 DIAGNOSIS — R2689 Other abnormalities of gait and mobility: Secondary | ICD-10-CM

## 2021-01-08 DIAGNOSIS — M6281 Muscle weakness (generalized): Secondary | ICD-10-CM | POA: Diagnosis not present

## 2021-01-08 DIAGNOSIS — M25561 Pain in right knee: Secondary | ICD-10-CM | POA: Diagnosis not present

## 2021-01-08 DIAGNOSIS — Z96651 Presence of right artificial knee joint: Secondary | ICD-10-CM

## 2021-01-08 DIAGNOSIS — M25661 Stiffness of right knee, not elsewhere classified: Secondary | ICD-10-CM | POA: Diagnosis not present

## 2021-01-08 DIAGNOSIS — R2681 Unsteadiness on feet: Secondary | ICD-10-CM

## 2021-01-08 DIAGNOSIS — R42 Dizziness and giddiness: Secondary | ICD-10-CM

## 2021-01-08 DIAGNOSIS — R600 Localized edema: Secondary | ICD-10-CM

## 2021-01-08 MED ORDER — MUPIROCIN CALCIUM 2 % EX CREA: 1.0000 "application " | TOPICAL_CREAM | Freq: Two times a day (BID) | CUTANEOUS | 0 refills | Status: AC

## 2021-01-08 MED ORDER — FLUCONAZOLE 150 MG PO TABS: 150.0000 mg | ORAL_TABLET | Freq: Once | ORAL | 0 refills | Status: AC

## 2021-01-08 MED ORDER — SULFAMETHOXAZOLE-TRIMETHOPRIM 800-160 MG PO TABS: 1.0000 | ORAL_TABLET | Freq: Two times a day (BID) | ORAL | 0 refills | Status: DC

## 2021-01-08 NOTE — Therapy (Signed)
[facility] Therapy [address] [place], [place], [address] Phone: [phone]   Fax:  [phone]  Physical Therapy Treatment  Patient Details  Name: Sally Hancock MRN: [id number] Date of Birth: Oct 26, 1975 Referring Provider (PT): Persons, [place]ely [place], [place]   Encounter Date: 01/08/2021   PT End of Session - 01/08/21 1507    Visit Number 4    Number of Visits 24    Date for PT Re-Evaluation 02/25/21    PT Start Time 1430    PT Stop Time 1519    PT Time Calculation (min) 49 min    Activity Tolerance Patient tolerated treatment well    Behavior During Therapy [facility] for tasks assessed/performed           Past Medical History:  Diagnosis Date  . Arthritis   . Chronic bronchitis ([place])   . Fibromyalgia   . GERD (gastroesophageal reflux disease)   . Headache    migraines  . History of hiatal hernia   . Trauma   . Vaginal Pap smear, abnormal     Past Surgical History:  Procedure Laterality Date  . CESAREAN SECTION    . CHOLECYSTECTOMY  2012  . FRACTURE SURGERY Right 08/2015   acetabulum fx  . HIP SURGERY    . KNEE SURGERY    . TONSILLECTOMY     @ 45 years old  . TOTAL KNEE ARTHROPLASTY Right 12/13/2020   Procedure: RIGHT TOTAL KNEE ARTHROPLASTY;  Surgeon: Newt Minion, MD;  Location: [place];  Service: Orthopedics;  Laterality: Right;    There were no vitals filed for this visit.   Subjective Assessment - 01/08/21 1439    Subjective knee is stiff today, working on exercies    Pertinent History arthritis, fibromyalgia    Limitations Standing;Walking    Patient Stated Goals regain mobility, improve motion in knee, walk better    Currently in Pain? Yes    Pain Score 5     Pain Location Knee    Pain Orientation Right    Pain Descriptors / Indicators Aching;Sore;Tightness;Throbbing    Pain Type Acute pain;Surgical pain    Pain Onset 1 to 4 weeks ago    Pain Frequency Constant    Aggravating Factors  extension, walking    Pain Relieving  Factors meds              [facility] PT Assessment - 01/08/21 1458      AROM   Right Knee Extension -12    Right Knee Flexion 90                         [facility] Adult PT Treatment/Exercise - 01/08/21 1441      Knee/Hip Exercises: Stretches   Hip Flexor Stretch Limitations seated tailgate knee flexion heelsides,  self O.P 10 sec X 10      Knee/Hip Exercises: Aerobic   Recumbent Bike full revolutions x 8 min      Knee/Hip Exercises: Seated   Long Arc Quad Right;3 sets;10 reps;Weights    Long Arc Quad Weight 3 lbs.      Knee/Hip Exercises: Supine   Quad Sets Right;20 reps    Quad Sets Limitations heel on red physioball    Heel Slides Right;20 reps    Heel Slides Limitations AA with strap and heel on red physioball      Modalities   Modalities Vasopneumatic      Vasopneumatic   Number Minutes Vasopneumatic  10  minutes    Vasopnuematic Location  Knee    Vasopneumatic Pressure Medium    Vasopneumatic Temperature  34      Manual Therapy   Manual therapy comments supine knee extension x 3 min with overpressure - limited tolerance today due to increased pain/soreness                    PT Short Term Goals - 01/08/21 1509      PT SHORT TERM GOAL #1   Title Independent with initial HEP    Baseline 5/25: performing 4-5 times a day    Time 4    Period Weeks    Status Achieved    Target Date 01/28/21      PT SHORT TERM GOAL #2   Title Rt knee AROM improved -8-95 for improved function    Time 4    Period Weeks    Status On-going    Target Date 01/28/21             PT Long Term Goals - 01/08/21 1509      PT LONG TERM GOAL #1   Title Independent with final HEP    Time 8    Period Weeks    Status On-going    Target Date 02/25/21      PT LONG TERM GOAL #2   Title Rt knee AROM improved -5-105 for improved function    Time 8    Period Weeks    Status On-going      PT LONG TERM GOAL #3   Title Amb independently without significant  deviations for improved function    Time 8    Period Weeks    Status On-going      PT LONG TERM GOAL #4   Title Report pain < 2/10 for improved function    Time 8    Period Weeks    Status On-going      PT LONG TERM GOAL #5   Title FOTO score improved to 53 for improved function    Time 8    Period Weeks    Status On-going                 Plan - 01/08/21 1510    Clinical Impression Statement Steady progress with AROM noted today and has met STG #1 reporting compliance 4-5x/day.  Continue to focus on ROM at this time with strengthening aspect as we work on ROM.  Will continue to benefit from PT to maximize function.    Personal Factors and Comorbidities Comorbidity 2    Comorbidities arthritis, fibromyalgia    Examination-Activity Limitations Sit;Bathing;Sleep;Squat;Stairs;Stand;Dressing;Transfers;Locomotion Level    Examination-Participation Restrictions Community Activity;Shop;Driving;Occupation;Meal Prep;Yard Work    Merchant [employer] officer Evolving/Moderate complexity    Rehab Potential Good    PT Frequency 3x / week   2-3x/wk   PT Duration 8 weeks    PT Treatment/Interventions ADLs/Self Care Home Management;Aquatic Therapy;Canalith Repostioning;Cryotherapy;Electrical Stimulation;Moist Heat;Balance training;Therapeutic exercise;Therapeutic activities;Functional mobility training;Stair training;Gait training;DME Instruction;Neuromuscular re-education;Patient/family education;Manual techniques;Vestibular;Vasopneumatic Device;Taping;Dry needling;Passive range of motion    PT Next Visit Plan Continued extension training in passive and active movement, static balance intervention.    PT Home Exercise Plan Access Code: [email or web address]    Consulted and Agree with Plan of Care Patient           Patient will benefit from skilled therapeutic intervention in order to improve the following deficits and impairments:  Abnormal gait,Increased edema,Decreased scar  mobility,Decreased knowledge of use of  DME,Decreased activity tolerance,Decreased mobility,Difficulty walking,Decreased balance,Decreased strength,Pain,Decreased range of motion,Dizziness  Visit Diagnosis: Acute pain of right knee  Stiffness of right knee, not elsewhere classified  Other abnormalities of gait and mobility  Muscle weakness (generalized)  Localized edema  Dizziness and giddiness  Unsteadiness on feet     Problem List Patient Active Problem List   Diagnosis Date Noted  . Arthritis of right knee 12/13/2020  . Unilateral primary osteoarthritis, right knee       Laureen Abrahams, PT, DPT 01/08/21 3:15 PM    [facility] [address] [place], [place], [address] Phone: [phone]   Fax:  [phone]  Name: JACILYN SANPEDRO MRN: [id number] Date of Birth: August 30, 1975

## 2021-01-08 NOTE — Progress Notes (Signed)
Post-Op Visit Note   Patient: Sally Hancock           Date of Birth: [birth year]           MRN: [id number] Visit Date: 01/08/2021 PCP: Galvin Proffer, MD  Chief Complaint:  Chief Complaint  Patient presents with  . Right Knee - Routine Post Op    12/13/2020 right total knee     HPI:  HPI The patient is a 45 year old woman who presents status post right total knee arthroplasty on April 29 of this year.  Today she presents with concern of possible pus coming from the middle of her incision.  She states it began last night she reports that it was opaque.  She is associated some surrounding tenderness to the area of this drainage is coming out of she denies any fever or chills no nausea Ortho Exam Examination of the right knee incision this is well-healed there is 1 area where there is 1 pinpoint opening there is 1 drop of serous fluid.  There is no surrounding erythema there is surrounding tenderness no palpable abscess cannot express any further drainage.  Visit Diagnoses: No diagnosis found.  Plan: Out of abundance of caution we will place on a course of Bactrim she will begin daily dressing changes with mupirocin and a Band-Aid discussed return precautions she is aware of the on-call physician will call for any worsening of fever or chills  Follow-Up Instructions: Return in about 2 weeks (around 01/22/2021).   Imaging: No results found.  Orders:  No orders of the defined types were placed in this encounter.  Meds ordered this encounter  Medications  . fluconazole (DIFLUCAN) 150 MG tablet    Sig: Take 1 tablet (150 mg total) by mouth once for 1 dose.    Dispense:  3 tablet    Refill:  0  . sulfamethoxazole-trimethoprim (BACTRIM DS) 800-160 MG tablet    Sig: Take 1 tablet by mouth 2 (two) times daily.    Dispense:  20 tablet    Refill:  0     PMFS History: Patient Active Problem List   Diagnosis Date Noted  . Arthritis of right knee 12/13/2020  . Unilateral primary  osteoarthritis, right knee    Past Medical History:  Diagnosis Date  . Arthritis   . Chronic bronchitis ([facility])   . Fibromyalgia   . GERD (gastroesophageal reflux disease)   . Headache    migraines  . History of hiatal hernia   . Trauma   . Vaginal Pap smear, abnormal     Family History  Problem Relation Age of Onset  . Breast cancer Maternal Aunt     Past Surgical History:  Procedure Laterality Date  . CESAREAN SECTION    . CHOLECYSTECTOMY  2012  . FRACTURE SURGERY Right 08/2015   acetabulum fx  . HIP SURGERY    . KNEE SURGERY    . TONSILLECTOMY     @ 45 years old  . TOTAL KNEE ARTHROPLASTY Right 12/13/2020   Procedure: RIGHT TOTAL KNEE ARTHROPLASTY;  Surgeon: Nadara Mustard, MD;  Location: [facility];  Service: Orthopedics;  Laterality: Right;   Social History   Occupational History  . Not on file  Tobacco Use  . Smoking status: Current Every Day Smoker    Packs/day: 0.50    Years: 5.00    Pack years: 2.50    Types: Cigarettes  . Smokeless tobacco: Never Used  Vaping Use  . Vaping Use: Some  days  Substance and Sexual Activity  . Alcohol use: Never  . Drug use: Never  . Sexual activity: Not Currently    Birth control/protection: I.U.D.

## 2021-01-10 ENCOUNTER — Encounter: Payer: Self-pay | Admitting: Physical Therapy

## 2021-01-10 ENCOUNTER — Other Ambulatory Visit: Payer: Self-pay

## 2021-01-10 ENCOUNTER — Ambulatory Visit (INDEPENDENT_AMBULATORY_CARE_PROVIDER_SITE_OTHER): Payer: No Typology Code available for payment source | Admitting: Physical Therapy

## 2021-01-10 DIAGNOSIS — M6281 Muscle weakness (generalized): Secondary | ICD-10-CM | POA: Diagnosis not present

## 2021-01-10 DIAGNOSIS — R42 Dizziness and giddiness: Secondary | ICD-10-CM

## 2021-01-10 DIAGNOSIS — M25561 Pain in right knee: Secondary | ICD-10-CM

## 2021-01-10 DIAGNOSIS — M25661 Stiffness of right knee, not elsewhere classified: Secondary | ICD-10-CM | POA: Diagnosis not present

## 2021-01-10 DIAGNOSIS — R2689 Other abnormalities of gait and mobility: Secondary | ICD-10-CM | POA: Diagnosis not present

## 2021-01-10 DIAGNOSIS — R2681 Unsteadiness on feet: Secondary | ICD-10-CM

## 2021-01-10 DIAGNOSIS — R600 Localized edema: Secondary | ICD-10-CM

## 2021-01-10 NOTE — Therapy (Signed)
[facility] [address] [place], [place], [address] Phone: [phone]   Fax:  [phone]  Physical Therapy Treatment  Patient Details  Name: Sally Hancock MRN: [id number] Date of Birth: 1976-05-06 Referring Provider (PT): Persons, [place], [place]   Encounter Date: 01/10/2021   PT End of Session - 01/10/21 0841    Visit Number 5    Number of Visits 24    Date for PT Re-Evaluation 02/25/21    PT Start Time 0800    PT Stop Time 0851    PT Time Calculation (min) 51 min    Activity Tolerance Patient tolerated treatment well    Behavior During Therapy [facility] for tasks assessed/performed           Past Medical History:  Diagnosis Date  . Arthritis   . Chronic bronchitis ([facility])   . Fibromyalgia   . GERD (gastroesophageal reflux disease)   . Headache    migraines  . History of hiatal hernia   . Trauma   . Vaginal Pap smear, abnormal     Past Surgical History:  Procedure Laterality Date  . CESAREAN SECTION    . CHOLECYSTECTOMY  2012  . FRACTURE SURGERY Right 08/2015   acetabulum fx  . HIP SURGERY    . KNEE SURGERY    . TONSILLECTOMY     @ 45 years old  . TOTAL KNEE ARTHROPLASTY Right 12/13/2020   Procedure: RIGHT TOTAL KNEE ARTHROPLASTY;  Surgeon: Nadara Mustard, MD;  Location: [facility] OR;  Service: Orthopedics;  Laterality: Right;    There were no vitals filed for this visit.   Subjective Assessment - 01/10/21 0804    Subjective knee is stiff today, working on exercies    Pertinent History arthritis, fibromyalgia    Limitations Standing;Walking    Patient Stated Goals regain mobility, improve motion in knee, walk better    Currently in Pain? Yes    Pain Score 6    3/10 with general activity   Pain Location Knee    Pain Orientation Right    Pain Descriptors / Indicators Aching;Sore;Tightness;Throbbing    Pain Type Acute pain;Surgical pain    Pain Onset 1 to 4 weeks ago    Pain Frequency Constant    Aggravating Factors  extension,  walking    Pain Relieving Factors meds                             [facility] Adult PT Treatment/Exercise - 01/10/21 0806      Knee/Hip Exercises: Stretches   Passive Hamstring Stretch Right;30 seconds;5 reps    Passive Hamstring Stretch Limitations seated with overpressure at distal thigh      Knee/Hip Exercises: Aerobic   Recumbent Bike full revolutions x 8 min; seat 6      Knee/Hip Exercises: Machines for Strengthening   Cybex Knee Extension RLE only 3x10; 5#    Cybex Knee Flexion RLE only 3x10; 15#      Knee/Hip Exercises: Standing   Terminal Knee Extension Right;3 sets;10 reps    Theraband Level (Terminal Knee Extension) Level 3 (Green)      Knee/Hip Exercises: Seated   Other Seated Knee/Hip Exercises seated SLR 3x10      Vasopneumatic   Number Minutes Vasopneumatic  10 minutes    Vasopnuematic Location  Knee    Vasopneumatic Pressure Medium    Vasopneumatic Temperature  34  PT Short Term Goals - 01/08/21 1509      PT SHORT TERM GOAL #1   Title Independent with initial HEP    Baseline 5/25: performing 4-5 times a day    Time 4    Period Weeks    Status Achieved    Target Date 01/28/21      PT SHORT TERM GOAL #2   Title Rt knee AROM improved -8-95 for improved function    Time 4    Period Weeks    Status On-going    Target Date 01/28/21             PT Long Term Goals - 01/08/21 1509      PT LONG TERM GOAL #1   Title Independent with final HEP    Time 8    Period Weeks    Status On-going    Target Date 02/25/21      PT LONG TERM GOAL #2   Title Rt knee AROM improved -5-105 for improved function    Time 8    Period Weeks    Status On-going      PT LONG TERM GOAL #3   Title Amb independently without significant deviations for improved function    Time 8    Period Weeks    Status On-going      PT LONG TERM GOAL #4   Title Report pain < 2/10 for improved function    Time 8    Period Weeks    Status  On-going      PT LONG TERM GOAL #5   Title FOTO score improved to 53 for improved function    Time 8    Period Weeks    Status On-going                 Plan - 01/10/21 0841    Clinical Impression Statement Pt tolerated session well today focusing on strengthening with TKE focus.  Will continue to benefit from PT to maximize function.    Personal Factors and Comorbidities Comorbidity 2    Comorbidities arthritis, fibromyalgia    Examination-Activity Limitations Sit;Bathing;Sleep;Squat;Stairs;Stand;Dressing;Transfers;Locomotion Level    Examination-Participation Restrictions Community Activity;Shop;Driving;Occupation;Meal Prep;Yard Work    Conservation officer, historic buildings Evolving/Moderate complexity    Rehab Potential Good    PT Frequency 3x / week   2-3x/wk   PT Duration 8 weeks    PT Treatment/Interventions ADLs/Self Care Home Management;Aquatic Therapy;Canalith Repostioning;Cryotherapy;Electrical Stimulation;Moist Heat;Balance training;Therapeutic exercise;Therapeutic activities;Functional mobility training;Stair training;Gait training;DME Instruction;Neuromuscular re-education;Patient/family education;Manual techniques;Vestibular;Vasopneumatic Device;Taping;Dry needling;Passive range of motion    PT Next Visit Plan Continued extension training in passive and active movement, static balance intervention.; continue TKE focus    PT Home Exercise Plan Access Code: [email or web address]    Consulted and Agree with Plan of Care Patient           Patient will benefit from skilled therapeutic intervention in order to improve the following deficits and impairments:  Abnormal gait,Increased edema,Decreased scar mobility,Decreased knowledge of use of DME,Decreased activity tolerance,Decreased mobility,Difficulty walking,Decreased balance,Decreased strength,Pain,Decreased range of motion,Dizziness  Visit Diagnosis: Acute pain of right knee  Stiffness of right knee, not elsewhere  classified  Other abnormalities of gait and mobility  Muscle weakness (generalized)  Localized edema  Dizziness and giddiness  Unsteadiness on feet     Problem List Patient Active Problem List   Diagnosis Date Noted  . Arthritis of right knee 12/13/2020  . Unilateral primary osteoarthritis, right knee       Burnett Corrente  Ashley Royalty, PT, DPT 01/10/21 8:43 AM    [facility] [address] [place], [place], [address] Phone: [phone]   Fax:  [phone]  Name: ANNASOFIA POHL MRN: [id number] Date of Birth: 10/02/1975

## 2021-01-15 ENCOUNTER — Encounter: Payer: Self-pay | Admitting: Physical Therapy

## 2021-01-15 ENCOUNTER — Other Ambulatory Visit: Payer: Self-pay

## 2021-01-15 ENCOUNTER — Ambulatory Visit (INDEPENDENT_AMBULATORY_CARE_PROVIDER_SITE_OTHER): Payer: No Typology Code available for payment source | Admitting: Physical Therapy

## 2021-01-15 DIAGNOSIS — R42 Dizziness and giddiness: Secondary | ICD-10-CM

## 2021-01-15 DIAGNOSIS — M6281 Muscle weakness (generalized): Secondary | ICD-10-CM

## 2021-01-15 DIAGNOSIS — R2689 Other abnormalities of gait and mobility: Secondary | ICD-10-CM | POA: Diagnosis not present

## 2021-01-15 DIAGNOSIS — M25561 Pain in right knee: Secondary | ICD-10-CM | POA: Diagnosis not present

## 2021-01-15 DIAGNOSIS — R600 Localized edema: Secondary | ICD-10-CM

## 2021-01-15 DIAGNOSIS — M25661 Stiffness of right knee, not elsewhere classified: Secondary | ICD-10-CM

## 2021-01-15 DIAGNOSIS — R2681 Unsteadiness on feet: Secondary | ICD-10-CM

## 2021-01-15 NOTE — Therapy (Signed)
[facility] [address] [place], [place], [address] Phone: [phone]   Fax:  [phone]  Physical Therapy Treatment  Patient Details  Name: Sally Hancock MRN: [id number] Date of Birth: 09-May-1976 Referring Provider (PT): Persons, [place], [place]   Encounter Date: 01/15/2021   PT End of Session - 01/15/21 0921    Visit Number 6    Number of Visits 24    Date for PT Re-Evaluation 02/25/21    PT Start Time 0838    PT Stop Time 0927    PT Time Calculation (min) 49 min    Activity Tolerance Patient tolerated treatment well    Behavior During Therapy [facility] for tasks assessed/performed           Past Medical History:  Diagnosis Date  . Arthritis   . Chronic bronchitis ([facility])   . Fibromyalgia   . GERD (gastroesophageal reflux disease)   . Headache    migraines  . History of hiatal hernia   . Trauma   . Vaginal Pap smear, abnormal     Past Surgical History:  Procedure Laterality Date  . CESAREAN SECTION    . CHOLECYSTECTOMY  2012  . FRACTURE SURGERY Right 08/2015   acetabulum fx  . HIP SURGERY    . KNEE SURGERY    . TONSILLECTOMY     @ 45 years old  . TOTAL KNEE ARTHROPLASTY Right 12/13/2020   Procedure: RIGHT TOTAL KNEE ARTHROPLASTY;  Surgeon: Nadara Mustard, MD;  Location: [facility] OR;  Service: Orthopedics;  Laterality: Right;    There were no vitals filed for this visit.   Subjective Assessment - 01/15/21 0840    Subjective knee is stiff this morning    Pertinent History arthritis, fibromyalgia    Limitations Standing;Walking    Patient Stated Goals regain mobility, improve motion in knee, walk better    Currently in Pain? Yes    Pain Location Knee    Pain Orientation Right    Pain Descriptors / Indicators Aching;Sore;Tightness    Pain Type Acute pain;Surgical pain    Pain Onset 1 to 4 weeks ago    Pain Frequency Constant    Aggravating Factors  extension, walking    Pain Relieving Factors meds                              [facility] Adult PT Treatment/Exercise - 01/15/21 0842      Knee/Hip Exercises: Stretches   Passive Hamstring Stretch Right;30 seconds;5 reps    Passive Hamstring Stretch Limitations seated with overpressure at distal thigh    Gastroc Stretch 30 seconds;3 reps;Right    Gastroc Stretch Limitations slantboard      Knee/Hip Exercises: Aerobic   Nustep L6 x 8 min; LEs only      Knee/Hip Exercises: Machines for Strengthening   Cybex Knee Flexion RLE only 3x15; 25#      Knee/Hip Exercises: Supine   Heel Prop for Knee Extension 2 minutes;3 minutes;Weight    Heel Prop for Knee Extension Weight (lbs) 5      Vasopneumatic   Number Minutes Vasopneumatic  10 minutes    Vasopnuematic Location  Knee    Vasopneumatic Pressure Medium    Vasopneumatic Temperature  34                    PT Short Term Goals - 01/08/21 1509      PT SHORT TERM GOAL #1  Title Independent with initial HEP    Baseline 5/25: performing 4-5 times a day    Time 4    Period Weeks    Status Achieved    Target Date 01/28/21      PT SHORT TERM GOAL #2   Title Rt knee AROM improved -8-95 for improved function    Time 4    Period Weeks    Status On-going    Target Date 01/28/21             PT Long Term Goals - 01/08/21 1509      PT LONG TERM GOAL #1   Title Independent with final HEP    Time 8    Period Weeks    Status On-going    Target Date 02/25/21      PT LONG TERM GOAL #2   Title Rt knee AROM improved -5-105 for improved function    Time 8    Period Weeks    Status On-going      PT LONG TERM GOAL #3   Title Amb independently without significant deviations for improved function    Time 8    Period Weeks    Status On-going      PT LONG TERM GOAL #4   Title Report pain < 2/10 for improved function    Time 8    Period Weeks    Status On-going      PT LONG TERM GOAL #5   Title FOTO score improved to 53 for improved function    Time 8    Period  Weeks    Status On-going                 Plan - 01/15/21 3546    Clinical Impression Statement Session today focused on extension ROM and improvement noted with LLLD stretch.  Pt will continue to benefit from PT to maximize function.  She is working to go to gym 3 days/wk to work on strengthening there.    Personal Factors and Comorbidities Comorbidity 2    Comorbidities arthritis, fibromyalgia    Examination-Activity Limitations Sit;Bathing;Sleep;Squat;Stairs;Stand;Dressing;Transfers;Locomotion Level    Examination-Participation Restrictions Community Activity;Shop;Driving;Occupation;Meal Prep;Yard Work    Conservation officer, historic buildings Evolving/Moderate complexity    Rehab Potential Good    PT Frequency 3x / week   2-3x/wk   PT Duration 8 weeks    PT Treatment/Interventions ADLs/Self Care Home Management;Aquatic Therapy;Canalith Repostioning;Cryotherapy;Electrical Stimulation;Moist Heat;Balance training;Therapeutic exercise;Therapeutic activities;Functional mobility training;Stair training;Gait training;DME Instruction;Neuromuscular re-education;Patient/family education;Manual techniques;Vestibular;Vasopneumatic Device;Taping;Dry needling;Passive range of motion    PT Next Visit Plan Continued extension training in passive and active movement, static balance intervention.; continue TKE focus; functional strength and balance    PT Home Exercise Plan Access Code: [email or web address]    Consulted and Agree with Plan of Care Patient           Patient will benefit from skilled therapeutic intervention in order to improve the following deficits and impairments:  Abnormal gait,Increased edema,Decreased scar mobility,Decreased knowledge of use of DME,Decreased activity tolerance,Decreased mobility,Difficulty walking,Decreased balance,Decreased strength,Pain,Decreased range of motion,Dizziness  Visit Diagnosis: Acute pain of right knee  Stiffness of right knee, not elsewhere classified  Other  abnormalities of gait and mobility  Muscle weakness (generalized)  Localized edema  Dizziness and giddiness  Unsteadiness on feet     Problem List Patient Active Problem List   Diagnosis Date Noted  . Arthritis of right knee 12/13/2020  . Unilateral primary osteoarthritis, right knee  Clarita Crane, PT, DPT 01/15/21 10:08 AM     [facility] [address] [place], [place], [address] Phone: [phone]   Fax:  [phone]  Name: Sally Hancock MRN: [id number] Date of Birth: 09-04-1975

## 2021-01-17 ENCOUNTER — Ambulatory Visit (INDEPENDENT_AMBULATORY_CARE_PROVIDER_SITE_OTHER): Payer: No Typology Code available for payment source | Admitting: Rehabilitative and Restorative Service Providers"

## 2021-01-17 ENCOUNTER — Other Ambulatory Visit: Payer: Self-pay

## 2021-01-17 DIAGNOSIS — M6281 Muscle weakness (generalized): Secondary | ICD-10-CM | POA: Diagnosis not present

## 2021-01-17 DIAGNOSIS — M25561 Pain in right knee: Secondary | ICD-10-CM

## 2021-01-17 DIAGNOSIS — R2689 Other abnormalities of gait and mobility: Secondary | ICD-10-CM | POA: Diagnosis not present

## 2021-01-17 DIAGNOSIS — M25661 Stiffness of right knee, not elsewhere classified: Secondary | ICD-10-CM

## 2021-01-17 DIAGNOSIS — R2681 Unsteadiness on feet: Secondary | ICD-10-CM

## 2021-01-17 DIAGNOSIS — R600 Localized edema: Secondary | ICD-10-CM

## 2021-01-17 DIAGNOSIS — R42 Dizziness and giddiness: Secondary | ICD-10-CM

## 2021-01-17 NOTE — Therapy (Signed)
[facility] Physical Therapy [address] [place], [place], [address] Phone: [phone]   Fax:  [phone]  Physical Therapy Treatment  Patient Details  Name: LADAJAH SOLTYS MRN: [id number] Date of Birth: 1976-03-17 Referring Provider (PT): Persons, [place], [place]   Encounter Date: 01/17/2021   PT End of Session - 01/17/21 0847    Visit Number 7    Number of Visits 24    Date for PT Re-Evaluation 02/25/21    PT Start Time 0842    PT Stop Time 0929    PT Time Calculation (min) 47 min    Activity Tolerance Patient tolerated treatment well;No increased pain    Behavior During Therapy WFL for tasks assessed/performed           Past Medical History:  Diagnosis Date  . Arthritis   . Chronic bronchitis (HCC)   . Fibromyalgia   . GERD (gastroesophageal reflux disease)   . Headache    migraines  . History of hiatal hernia   . Trauma   . Vaginal Pap smear, abnormal     Past Surgical History:  Procedure Laterality Date  . CESAREAN SECTION    . CHOLECYSTECTOMY  2012  . FRACTURE SURGERY Right 08/2015   acetabulum fx  . HIP SURGERY    . KNEE SURGERY    . TONSILLECTOMY     @ 45 years old  . TOTAL KNEE ARTHROPLASTY Right 12/13/2020   Procedure: RIGHT TOTAL KNEE ARTHROPLASTY;  Surgeon: Nadara Mustard, MD;  Location: [facility] OR;  Service: Orthopedics;  Laterality: Right;    There were no vitals filed for this visit.   Subjective Assessment - 01/17/21 0847    Subjective I am tired. I didn't get any sleep. Very restless night. Pain is a 5/10. Pain didn't keep me awake...just restless.    Currently in Pain? Yes    Pain Score 5     Pain Location Knee    Pain Orientation Right    Pain Descriptors / Indicators Aching    Pain Type Acute pain;Surgical pain                             [facility] Adult PT Treatment/Exercise - 01/17/21 0001      Knee/Hip Exercises: Aerobic   Nustep L6 x 5 min bil LEs only      Knee/Hip Exercises: Supine   Other  Supine Knee/Hip Exercises iso knee flexion hold 3x20 sec with PT assist to hold at end ROM; iso hold of max knee flexion x 60 sec, held knee flexion with bridge x 20, heel slide x 20; quad sets x 15 with 2-3 sec hold; SAQ/quad set combo 2 lb x 20    Other Supine Knee/Hip Exercises R Thomas stretch 5x45 sec with pt liking stretch; gastroc stretch with sheet 3x30 sec; SLR with quad set x 10 with concentration on eccentric control; glute set with clam shell x 20; Thomas stretch 2x30 sec                    PT Short Term Goals - 01/17/21 0915      PT SHORT TERM GOAL #1   Title Independent with initial HEP    Status Achieved      PT SHORT TERM GOAL #2   Title Rt knee AROM improved -8-95 for improved function    Status On-going  PT Long Term Goals - 01/08/21 1509      PT LONG TERM GOAL #1   Title Independent with final HEP    Time 8    Period Weeks    Status On-going    Target Date 02/25/21      PT LONG TERM GOAL #2   Title Rt knee AROM improved -5-105 for improved function    Time 8    Period Weeks    Status On-going      PT LONG TERM GOAL #3   Title Amb independently without significant deviations for improved function    Time 8    Period Weeks    Status On-going      PT LONG TERM GOAL #4   Title Report pain < 2/10 for improved function    Time 8    Period Weeks    Status On-going      PT LONG TERM GOAL #5   Title FOTO score improved to 53 for improved function    Time 8    Period Weeks    Status On-going                 Plan - 01/17/21 0911    Clinical Impression Statement Pt presents to PT with limited R knee flexion/ext and tight hip flexors/gastroc R. Pt would continue to benefit from PT for functional A/PROM R knee and R LE strengthening. Pt anxious about RTW. -25 ext, flex 107 supine after hip flexor stretch and after therex. Pt felt she was walking better after tx; pleased with progress. - SLR extension lag    PT  Treatment/Interventions ADLs/Self Care Home Management;Aquatic Therapy;Canalith Repostioning;Cryotherapy;Electrical Stimulation;Moist Heat;Balance training;Therapeutic exercise;Therapeutic activities;Functional mobility training;Stair training;Gait training;DME Instruction;Neuromuscular re-education;Patient/family education;Manual techniques;Vestibular;Vasopneumatic Device;Taping;Dry needling;Passive range of motion    PT Next Visit Plan Continued extension training in passive and active movement, static balance intervention.; continue TKE focus; functional strength and balance    Consulted and Agree with Plan of Care Patient           Patient will benefit from skilled therapeutic intervention in order to improve the following deficits and impairments:  Abnormal gait,Increased edema,Decreased scar mobility,Decreased knowledge of use of DME,Decreased activity tolerance,Decreased mobility,Difficulty walking,Decreased balance,Decreased strength,Pain,Decreased range of motion,Dizziness  Visit Diagnosis: Acute pain of right knee  Stiffness of right knee, not elsewhere classified  Other abnormalities of gait and mobility  Muscle weakness (generalized)  Localized edema  Dizziness and giddiness  Unsteadiness on feet     Problem List Patient Active Problem List   Diagnosis Date Noted  . Arthritis of right knee 12/13/2020  . Unilateral primary osteoarthritis, right knee     Luna Fuse , PT, DPT 01/17/2021, 9:33 AM  [facility] Physical Therapy [address] [place], [place], [address] Phone: [phone]   Fax:  [phone]  Name: MARONDA CAISON MRN: [id number] Date of Birth: Dec 13, 1975

## 2021-01-21 ENCOUNTER — Ambulatory Visit (INDEPENDENT_AMBULATORY_CARE_PROVIDER_SITE_OTHER): Payer: No Typology Code available for payment source | Admitting: Physical Therapy

## 2021-01-21 ENCOUNTER — Encounter: Payer: Self-pay | Admitting: Physical Therapy

## 2021-01-21 ENCOUNTER — Other Ambulatory Visit: Payer: Self-pay

## 2021-01-21 DIAGNOSIS — M6281 Muscle weakness (generalized): Secondary | ICD-10-CM | POA: Diagnosis not present

## 2021-01-21 DIAGNOSIS — M25561 Pain in right knee: Secondary | ICD-10-CM | POA: Diagnosis not present

## 2021-01-21 DIAGNOSIS — M25661 Stiffness of right knee, not elsewhere classified: Secondary | ICD-10-CM

## 2021-01-21 DIAGNOSIS — R2681 Unsteadiness on feet: Secondary | ICD-10-CM

## 2021-01-21 DIAGNOSIS — R600 Localized edema: Secondary | ICD-10-CM

## 2021-01-21 DIAGNOSIS — R2689 Other abnormalities of gait and mobility: Secondary | ICD-10-CM

## 2021-01-21 DIAGNOSIS — R42 Dizziness and giddiness: Secondary | ICD-10-CM

## 2021-01-21 NOTE — Therapy (Signed)
[facility] Physical Therapy [address] [place], [place], [address] Phone: [phone]   Fax:  [phone]  Physical Therapy Treatment  Patient Details  Name: Sally Hancock MRN: [id number] Date of Birth: September 02, 1975 Referring Provider (PT): Persons, [place], [place]   Encounter Date: 01/21/2021   PT End of Session - 01/21/21 0958    Visit Number 8    Number of Visits 24    Date for PT Re-Evaluation 02/25/21    PT Start Time 0918    PT Stop Time 0958    PT Time Calculation (min) 40 min    Activity Tolerance Patient tolerated treatment well;No increased pain    Behavior During Therapy WFL for tasks assessed/performed           Past Medical History:  Diagnosis Date  . Arthritis   . Chronic bronchitis (HCC)   . Fibromyalgia   . GERD (gastroesophageal reflux disease)   . Headache    migraines  . History of hiatal hernia   . Trauma   . Vaginal Pap smear, abnormal     Past Surgical History:  Procedure Laterality Date  . CESAREAN SECTION    . CHOLECYSTECTOMY  2012  . FRACTURE SURGERY Right 08/2015   acetabulum fx  . HIP SURGERY    . KNEE SURGERY    . TONSILLECTOMY     @ 45 years old  . TOTAL KNEE ARTHROPLASTY Right 12/13/2020   Procedure: RIGHT TOTAL KNEE ARTHROPLASTY;  Surgeon: Nadara Mustard, MD;  Location: [facility] OR;  Service: Orthopedics;  Laterality: Right;    There were no vitals filed for this visit.   Subjective Assessment - 01/21/21 0921    Subjective knee is just sore today    Pertinent History arthritis, fibromyalgia    Patient Stated Goals regain mobility, improve motion in knee, walk better    Currently in Pain? Yes    Pain Score 2     Pain Location Knee    Pain Orientation Right    Pain Descriptors / Indicators Aching    Pain Type Acute pain;Surgical pain    Pain Onset 1 to 4 weeks ago    Pain Frequency Constant    Aggravating Factors  extension, walking    Pain Relieving Factors meds                              [facility] Adult PT Treatment/Exercise - 01/21/21 0923      Knee/Hip Exercises: Stretches   Passive Hamstring Stretch Right;30 seconds;5 reps    Passive Hamstring Stretch Limitations seated with overpressure at distal thigh    Hip Flexor Stretch Right;3 reps;30 seconds    Hip Flexor Stretch Limitations seated on edge of mat table    Gastroc Stretch 30 seconds;3 reps;Right    Gastroc Stretch Limitations seated with strap      Knee/Hip Exercises: Aerobic   Nustep L6 x 8 min; LEs only      Knee/Hip Exercises: Machines for Strengthening   Cybex Knee Extension RLE only 2x15; 5#    Cybex Knee Flexion RLE only 2x15; 25#      Knee/Hip Exercises: Standing   SLS 3x10 sec with bil UE support; RLE      Knee/Hip Exercises: Seated   Sit to Sand 2 sets;10 reps;without UE support                    PT Short Term Goals - 01/17/21  0915      PT SHORT TERM GOAL #1   Title Independent with initial HEP    Status Achieved      PT SHORT TERM GOAL #2   Title Rt knee AROM improved -8-95 for improved function    Status On-going             PT Long Term Goals - 01/08/21 1509      PT LONG TERM GOAL #1   Title Independent with final HEP    Time 8    Period Weeks    Status On-going    Target Date 02/25/21      PT LONG TERM GOAL #2   Title Rt knee AROM improved -5-105 for improved function    Time 8    Period Weeks    Status On-going      PT LONG TERM GOAL #3   Title Amb independently without significant deviations for improved function    Time 8    Period Weeks    Status On-going      PT LONG TERM GOAL #4   Title Report pain < 2/10 for improved function    Time 8    Period Weeks    Status On-going      PT LONG TERM GOAL #5   Title FOTO score improved to 53 for improved function    Time 8    Period Weeks    Status On-going                 Plan - 01/21/21 0959    Clinical Impression Statement Pt tolerated session well  today with focus on ROM and strengthening.  Progressing well with PT at this time, and will continue to benefit from PT to maximize function.    PT Treatment/Interventions ADLs/Self Care Home Management;Aquatic Therapy;Canalith Repostioning;Cryotherapy;Electrical Stimulation;Moist Heat;Balance training;Therapeutic exercise;Therapeutic activities;Functional mobility training;Stair training;Gait training;DME Instruction;Neuromuscular re-education;Patient/family education;Manual techniques;Vestibular;Vasopneumatic Device;Taping;Dry needling;Passive range of motion    PT Next Visit Plan Continued extension training in passive and active movement, static balance intervention.; continue TKE focus; functional strength and balance; measure/MD note    PT Home Exercise Plan Access Code: [email or web address]    Consulted and Agree with Plan of Care Patient           Patient will benefit from skilled therapeutic intervention in order to improve the following deficits and impairments:  Abnormal gait,Increased edema,Decreased scar mobility,Decreased knowledge of use of DME,Decreased activity tolerance,Decreased mobility,Difficulty walking,Decreased balance,Decreased strength,Pain,Decreased range of motion,Dizziness  Visit Diagnosis: Acute pain of right knee  Stiffness of right knee, not elsewhere classified  Other abnormalities of gait and mobility  Muscle weakness (generalized)  Localized edema  Dizziness and giddiness  Unsteadiness on feet     Problem List Patient Active Problem List   Diagnosis Date Noted  . Arthritis of right knee 12/13/2020  . Unilateral primary osteoarthritis, right knee       Clarita Crane, PT, DPT 01/21/21 10:01 AM    [facility] Physical Therapy [address] [place], [place], [address] Phone: [phone]   Fax:  [phone]  Name: Sally Hancock MRN: [id number] Date of Birth: [birth year]

## 2021-01-23 ENCOUNTER — Ambulatory Visit (INDEPENDENT_AMBULATORY_CARE_PROVIDER_SITE_OTHER): Payer: No Typology Code available for payment source | Admitting: Physical Therapy

## 2021-01-23 ENCOUNTER — Encounter: Payer: Self-pay | Admitting: Physical Therapy

## 2021-01-23 ENCOUNTER — Other Ambulatory Visit: Payer: Self-pay

## 2021-01-23 DIAGNOSIS — R600 Localized edema: Secondary | ICD-10-CM

## 2021-01-23 DIAGNOSIS — M6281 Muscle weakness (generalized): Secondary | ICD-10-CM

## 2021-01-23 DIAGNOSIS — R2689 Other abnormalities of gait and mobility: Secondary | ICD-10-CM | POA: Diagnosis not present

## 2021-01-23 DIAGNOSIS — M25561 Pain in right knee: Secondary | ICD-10-CM | POA: Diagnosis not present

## 2021-01-23 DIAGNOSIS — R42 Dizziness and giddiness: Secondary | ICD-10-CM

## 2021-01-23 DIAGNOSIS — M25661 Stiffness of right knee, not elsewhere classified: Secondary | ICD-10-CM

## 2021-01-23 NOTE — Therapy (Signed)
[facility] Physical Therapy [address] [place], [place], [address] Phone: [phone]   Fax:  [phone]  Physical Therapy Treatment  Patient Details  Name: Sally Hancock MRN: [id number] Date of Birth: September 20, 1975 Referring Provider (PT): Persons, [place], [place]   Encounter Date: 01/23/2021   PT End of Session - 01/23/21 1009     Visit Number 9    Number of Visits 24    Date for PT Re-Evaluation 02/25/21    PT Start Time 0927    PT Stop Time 1018    PT Time Calculation (min) 51 min    Activity Tolerance Patient tolerated treatment well;No increased pain    Behavior During Therapy WFL for tasks assessed/performed             Past Medical History:  Diagnosis Date   Arthritis    Chronic bronchitis (HCC)    Fibromyalgia    GERD (gastroesophageal reflux disease)    Headache    migraines   History of hiatal hernia    Trauma    Vaginal Pap smear, abnormal     Past Surgical History:  Procedure Laterality Date   CESAREAN SECTION     CHOLECYSTECTOMY  2012   FRACTURE SURGERY Right 08/2015   acetabulum fx   HIP SURGERY     KNEE SURGERY     TONSILLECTOMY     @ 45 years old   TOTAL KNEE ARTHROPLASTY Right 12/13/2020   Procedure: RIGHT TOTAL KNEE ARTHROPLASTY;  Surgeon: Nadara Mustard, MD;  Location: [facility] OR;  Service: Orthopedics;  Laterality: Right;    There were no vitals filed for this visit.   Subjective Assessment - 01/23/21 0929     Subjective doing well, walked in without cane today    Pertinent History arthritis, fibromyalgia    Patient Stated Goals regain mobility, improve motion in knee, walk better    Currently in Pain? Yes    Pain Score 2     Pain Location Knee    Pain Orientation Right    Pain Descriptors / Indicators Aching    Pain Type Acute pain;Surgical pain    Pain Onset 1 to 4 weeks ago    Pain Frequency Constant    Aggravating Factors  extension, walking    Pain Relieving Factors meds                [facility] PT  Assessment - 01/23/21 0958       Assessment   Medical Diagnosis Z96.651 (ICD-10-CM) - Status post right knee replacement    Referring Provider (PT) Persons, [place], [place]    Onset Date/Surgical Date 12/13/20      Observation/Other Assessments   Focus on Therapeutic Outcomes (FOTO)  52 (predicted 53)      AROM   Right Knee Extension -10    Right Knee Flexion 110      PROM   Right Knee Extension -3    Right Knee Flexion 112                           [facility] Adult PT Treatment/Exercise - 01/23/21 0932       Knee/Hip Exercises: Aerobic   Recumbent Bike L4 x 8 min      Knee/Hip Exercises: Machines for Strengthening   Cybex Knee Extension RLE only 2x15; 10#    Cybex Knee Flexion RLE only 2x15; 25#      Knee/Hip Exercises: Supine   Quad Sets  Right;20 reps    Quad Sets Limitations with heel prop    Heel Slides Right;20 reps    Heel Slides Limitations AA with strap and heel on red physioball    Heel Prop for Knee Extension 2 minutes;3 minutes;Weight    Heel Prop for Knee Extension Weight (lbs) 5      Vasopneumatic   Number Minutes Vasopneumatic  10 minutes    Vasopnuematic Location  Knee    Vasopneumatic Pressure Medium    Vasopneumatic Temperature  34                      PT Short Term Goals - 01/17/21 0915       PT SHORT TERM GOAL #1   Title Independent with initial HEP    Status Achieved      PT SHORT TERM GOAL #2   Title Rt knee AROM improved -8-95 for improved function    Status On-going               PT Long Term Goals - 01/08/21 1509       PT LONG TERM GOAL #1   Title Independent with final HEP    Time 8    Period Weeks    Status On-going    Target Date 02/25/21      PT LONG TERM GOAL #2   Title Rt knee AROM improved -5-105 for improved function    Time 8    Period Weeks    Status On-going      PT LONG TERM GOAL #3   Title Amb independently without significant deviations for improved function    Time 8     Period Weeks    Status On-going      PT LONG TERM GOAL #4   Title Report pain < 2/10 for improved function    Time 8    Period Weeks    Status On-going      PT LONG TERM GOAL #5   Title FOTO score improved to 53 for improved function    Time 8    Period Weeks    Status On-going                   Plan - 01/23/21 1009     Clinical Impression Statement Pt demonstrating steady improvement in ROM with extension being main focus at this time.  Will continue to benefit from PT to maximize function.    PT Treatment/Interventions ADLs/Self Care Home Management;Aquatic Therapy;Canalith Repostioning;Cryotherapy;Electrical Stimulation;Moist Heat;Balance training;Therapeutic exercise;Therapeutic activities;Functional mobility training;Stair training;Gait training;DME Instruction;Neuromuscular re-education;Patient/family education;Manual techniques;Vestibular;Vasopneumatic Device;Taping;Dry needling;Passive range of motion    PT Next Visit Plan Continued extension training in passive and active movement, static balance intervention.; continue TKE focus; functional strength and balance; maintain flexion    PT Home Exercise Plan Access Code: [email or web address]    Consulted and Agree with Plan of Care Patient             Patient will benefit from skilled therapeutic intervention in order to improve the following deficits and impairments:  Abnormal gait, Increased edema, Decreased scar mobility, Decreased knowledge of use of DME, Decreased activity tolerance, Decreased mobility, Difficulty walking, Decreased balance, Decreased strength, Pain, Decreased range of motion, Dizziness  Visit Diagnosis: Acute pain of right knee  Stiffness of right knee, not elsewhere classified  Other abnormalities of gait and mobility  Muscle weakness (generalized)  Dizziness and giddiness  Localized edema     Problem List Patient Active  Problem List   Diagnosis Date Noted   Arthritis of right knee  12/13/2020   Unilateral primary osteoarthritis, right knee      Clarita Crane, PT, DPT 01/23/21 11:18 AM    [facility] Physical Therapy [address] [place], [place], [address] Phone: [phone]   Fax:  [phone]  Name: RIKAYLA DEMMON MRN: [id number] Date of Birth: [birth year]

## 2021-01-24 ENCOUNTER — Ambulatory Visit (INDEPENDENT_AMBULATORY_CARE_PROVIDER_SITE_OTHER): Payer: No Typology Code available for payment source | Admitting: Family

## 2021-01-24 ENCOUNTER — Encounter: Payer: Self-pay | Admitting: Family

## 2021-01-24 DIAGNOSIS — M1711 Unilateral primary osteoarthritis, right knee: Secondary | ICD-10-CM

## 2021-01-24 DIAGNOSIS — Z96651 Presence of right artificial knee joint: Secondary | ICD-10-CM

## 2021-01-24 MED ORDER — DOXYCYCLINE HYCLATE 100 MG PO TABS: 100.0000 mg | ORAL_TABLET | Freq: Two times a day (BID) | ORAL | 0 refills | Status: AC

## 2021-01-24 NOTE — Progress Notes (Signed)
   Post-Op Visit Note   Patient: Sally Hancock           Date of Birth: Jul 27, 1976           MRN: [id number] Visit Date: 01/24/2021 PCP: Galvin Proffer, MD  Chief Complaint:  Chief Complaint  Patient presents with   Right Knee - Follow-up    HPI:  HPI The patient is a 45 year old woman seen status post right total knee arthroplasty on April 29.  She was treated with Bactrim at last visit for suture abscess.  She states she initially did well and then over the last few days has noticed tenderness has returned to 1 suture portal that is scabbed today. Ortho Exam On examination of the right knee overall the incision is well-healed she has excellent range of motion there is 1 area that is 5 mm in diameter that has some eschar and surrounding mild erythema.  Able to express 1 drop of purulence.  This was debrided there is underlying granulation no tracting.  No odor.  No warmth no ascending cellulitis  Visit Diagnoses:  1. Unilateral primary osteoarthritis, right knee   2. S/P total knee arthroplasty, right     Plan: We will place on doxycycline.  Probable suture abscess.  Follow-up in the office in 2 weeks.  Aware of return precautions.  Follow-Up Instructions: Return in about 2 weeks (around 02/07/2021).   Imaging: No results found.  Orders:  No orders of the defined types were placed in this encounter.  No orders of the defined types were placed in this encounter.    PMFS History: Patient Active Problem List   Diagnosis Date Noted   Arthritis of right knee 12/13/2020   Unilateral primary osteoarthritis, right knee    Past Medical History:  Diagnosis Date   Arthritis    Chronic bronchitis (HCC)    Fibromyalgia    GERD (gastroesophageal reflux disease)    Headache    migraines   History of hiatal hernia    Trauma    Vaginal Pap smear, abnormal     Family History  Problem Relation Age of Onset   Breast cancer Maternal Aunt     Past Surgical History:  Procedure  Laterality Date   CESAREAN SECTION     CHOLECYSTECTOMY  2012   FRACTURE SURGERY Right 08/2015   acetabulum fx   HIP SURGERY     KNEE SURGERY     TONSILLECTOMY     @ 45 years old   TOTAL KNEE ARTHROPLASTY Right 12/13/2020   Procedure: RIGHT TOTAL KNEE ARTHROPLASTY;  Surgeon: Nadara Mustard, MD;  Location: [facility] OR;  Service: Orthopedics;  Laterality: Right;   Social History   Occupational History   Not on file  Tobacco Use   Smoking status: Every Day    Packs/day: 0.50    Years: 5.00    Pack years: 2.50    Types: Cigarettes   Smokeless tobacco: Never  Vaping Use   Vaping Use: Some days  Substance and Sexual Activity   Alcohol use: Never   Drug use: Never   Sexual activity: Not Currently    Birth control/protection: I.U.D.

## 2021-01-28 ENCOUNTER — Other Ambulatory Visit: Payer: Self-pay

## 2021-01-28 ENCOUNTER — Ambulatory Visit (INDEPENDENT_AMBULATORY_CARE_PROVIDER_SITE_OTHER): Payer: No Typology Code available for payment source | Admitting: Physical Therapy

## 2021-01-28 ENCOUNTER — Encounter: Payer: Self-pay | Admitting: Physical Therapy

## 2021-01-28 DIAGNOSIS — M25561 Pain in right knee: Secondary | ICD-10-CM

## 2021-01-28 DIAGNOSIS — M25661 Stiffness of right knee, not elsewhere classified: Secondary | ICD-10-CM

## 2021-01-28 DIAGNOSIS — M6281 Muscle weakness (generalized): Secondary | ICD-10-CM

## 2021-01-28 DIAGNOSIS — R2689 Other abnormalities of gait and mobility: Secondary | ICD-10-CM

## 2021-01-28 DIAGNOSIS — R600 Localized edema: Secondary | ICD-10-CM

## 2021-01-28 DIAGNOSIS — R42 Dizziness and giddiness: Secondary | ICD-10-CM

## 2021-01-28 DIAGNOSIS — R2681 Unsteadiness on feet: Secondary | ICD-10-CM

## 2021-01-28 NOTE — Therapy (Signed)
[facility] Physical Therapy [address] [place], [place], [address] Phone: [phone]   Fax:  [phone]  Physical Therapy Treatment  Patient Details  Name: Sally Hancock MRN: [id number] Date of Birth: [birth year] Referring Provider (PT): Persons, [place], [place]   Encounter Date: 01/28/2021   PT End of Session - 01/28/21 1114     Visit Number 10    Number of Visits 24    Date for PT Re-Evaluation 02/25/21    PT Start Time 0930    PT Stop Time 1012    PT Time Calculation (min) 42 min    Activity Tolerance Patient tolerated treatment well;No increased pain    Behavior During Therapy WFL for tasks assessed/performed             Past Medical History:  Diagnosis Date   Arthritis    Chronic bronchitis (HCC)    Fibromyalgia    GERD (gastroesophageal reflux disease)    Headache    migraines   History of hiatal hernia    Trauma    Vaginal Pap smear, abnormal     Past Surgical History:  Procedure Laterality Date   CESAREAN SECTION     CHOLECYSTECTOMY  2012   FRACTURE SURGERY Right 08/2015   acetabulum fx   HIP SURGERY     KNEE SURGERY     TONSILLECTOMY     @ 45 years old   TOTAL KNEE ARTHROPLASTY Right 12/13/2020   Procedure: RIGHT TOTAL KNEE ARTHROPLASTY;  Surgeon: Nadara Mustard, MD;  [facility];  Service: Orthopedics;  Laterality: Right;    There were no vitals filed for this visit.   Subjective Assessment - 01/28/21 0932     Subjective knee is stiff and sore - Rt hip is a little sore.    Pertinent History arthritis, fibromyalgia    Patient Stated Goals regain mobility, improve motion in knee, walk better    Currently in Pain? Yes    Pain Score 2     Pain Location Knee    Pain Orientation Right    Pain Descriptors / Indicators Aching    Pain Type Acute pain;Surgical pain    Pain Onset 1 to 4 weeks ago    Pain Frequency Constant    Aggravating Factors  extension, walking    Pain Relieving Factors meds                                [facility] Adult PT Treatment/Exercise - 01/28/21 0934       Knee/Hip Exercises: Stretches   Passive Hamstring Stretch Right;30 seconds;5 reps    Passive Hamstring Stretch Limitations supine with overpressure at distal thigh    Hip Flexor Stretch Right;3 reps;30 seconds    Hip Flexor Stretch Limitations supine    Piriformis Stretch Right;3 reps;30 seconds    Piriformis Stretch Limitations figure-4      Knee/Hip Exercises: Aerobic   Nustep L7 x 8 min; LEs only      Knee/Hip Exercises: Seated   Long Arc Quad Right;3 sets;10 reps;Weights    Long Arc Quad Weight 3 lbs.      Knee/Hip Exercises: Supine   Short Arc Quad Sets Right;3 sets;10 reps    Short Arc Quad Sets Limitations 3#    Heel Prop for Knee Extension 3 minutes;Weight    Heel Prop for Knee Extension Weight (lbs) 3  PT Short Term Goals - 01/17/21 0915       PT SHORT TERM GOAL #1   Title Independent with initial HEP    Status Achieved      PT SHORT TERM GOAL #2   Title Rt knee AROM improved -8-95 for improved function    Status On-going               PT Long Term Goals - 01/08/21 1509       PT LONG TERM GOAL #1   Title Independent with final HEP    Time 8    Period Weeks    Status On-going    Target Date 02/25/21      PT LONG TERM GOAL #2   Title Rt knee AROM improved -5-105 for improved function    Time 8    Period Weeks    Status On-going      PT LONG TERM GOAL #3   Title Amb independently without significant deviations for improved function    Time 8    Period Weeks    Status On-going      PT LONG TERM GOAL #4   Title Report pain < 2/10 for improved function    Time 8    Period Weeks    Status On-going      PT LONG TERM GOAL #5   Title FOTO score improved to 53 for improved function    Time 8    Period Weeks    Status On-going                   Plan - 01/28/21 1114     Clinical Impression Statement  Continued focus on Rt knee extension and quad activation today as well as stretching for Rt hip as it has been more painful lately.  Progressing well with PT at this time.  Will continue to benefit from PT to maximize function.    PT Treatment/Interventions ADLs/Self Care Home Management;Aquatic Therapy;Canalith Repostioning;Cryotherapy;Electrical Stimulation;Moist Heat;Balance training;Therapeutic exercise;Therapeutic activities;Functional mobility training;Stair training;Gait training;DME Instruction;Neuromuscular re-education;Patient/family education;Manual techniques;Vestibular;Vasopneumatic Device;Taping;Dry needling;Passive range of motion    PT Next Visit Plan Continued extension training in passive and active movement, static balance intervention.; continue TKE focus; functional strength and balance; maintain flexion; get measurement    PT Home Exercise Plan Access Code: [email or web address]    Consulted and Agree with Plan of Care Patient             Patient will benefit from skilled therapeutic intervention in order to improve the following deficits and impairments:  Abnormal gait, Increased edema, Decreased scar mobility, Decreased knowledge of use of DME, Decreased activity tolerance, Decreased mobility, Difficulty walking, Decreased balance, Decreased strength, Pain, Decreased range of motion, Dizziness  Visit Diagnosis: Acute pain of right knee  Stiffness of right knee, not elsewhere classified  Other abnormalities of gait and mobility  Muscle weakness (generalized)  Dizziness and giddiness  Localized edema  Unsteadiness on feet     Problem List Patient Active Problem List   Diagnosis Date Noted   Arthritis of right knee 12/13/2020   Unilateral primary osteoarthritis, right knee      Clarita Crane, PT, DPT 01/28/21 11:16 AM   [facility] Physical Therapy [address] [place], [place], [address] Phone: [phone]   Fax:  [phone]  Name:  Sally Hancock MRN: [id number] Date of Birth: 11-06-1975

## 2021-01-31 ENCOUNTER — Other Ambulatory Visit: Payer: Self-pay

## 2021-01-31 ENCOUNTER — Ambulatory Visit (INDEPENDENT_AMBULATORY_CARE_PROVIDER_SITE_OTHER): Payer: No Typology Code available for payment source | Admitting: Physical Therapy

## 2021-01-31 ENCOUNTER — Encounter: Payer: Self-pay | Admitting: Physical Therapy

## 2021-01-31 DIAGNOSIS — M25661 Stiffness of right knee, not elsewhere classified: Secondary | ICD-10-CM

## 2021-01-31 DIAGNOSIS — R2689 Other abnormalities of gait and mobility: Secondary | ICD-10-CM

## 2021-01-31 DIAGNOSIS — M6281 Muscle weakness (generalized): Secondary | ICD-10-CM | POA: Diagnosis not present

## 2021-01-31 DIAGNOSIS — M25561 Pain in right knee: Secondary | ICD-10-CM

## 2021-01-31 NOTE — Therapy (Signed)
[facility] Physical Therapy [address] [place], [place], [address] Phone: [phone]   Fax:  [phone]  Physical Therapy Treatment  Patient Details  Name: Sally Hancock MRN: [id number] Date of Birth: 19-Jan-1976 Referring Provider (PT): Persons, [place], [place]   Encounter Date: 01/31/2021   PT End of Session - 01/31/21 0921     Visit Number 11    Number of Visits 24    Date for PT Re-Evaluation 02/25/21    PT Start Time 0925    PT Stop Time 1010    PT Time Calculation (min) 45 min    Activity Tolerance Patient tolerated treatment well;No increased pain    Behavior During Therapy WFL for tasks assessed/performed             Past Medical History:  Diagnosis Date   Arthritis    Chronic bronchitis (HCC)    Fibromyalgia    GERD (gastroesophageal reflux disease)    Headache    migraines   History of hiatal hernia    Trauma    Vaginal Pap smear, abnormal     Past Surgical History:  Procedure Laterality Date   CESAREAN SECTION     CHOLECYSTECTOMY  2012   FRACTURE SURGERY Right 08/2015   acetabulum fx   HIP SURGERY     KNEE SURGERY     TONSILLECTOMY     @ 45 years old   TOTAL KNEE ARTHROPLASTY Right 12/13/2020   Procedure: RIGHT TOTAL KNEE ARTHROPLASTY;  Surgeon: Nadara Mustard, MD;  Location: [facility] OR;  Service: Orthopedics;  Laterality: Right;    There were no vitals filed for this visit.   Subjective Assessment - 01/31/21 0921     Subjective Pt states the knee is a little sore today. She states she slipped but did not fall last night on his slippery deck.    Pertinent History arthritis, fibromyalgia    Patient Stated Goals regain mobility, improve motion in knee, walk better    Currently in Pain? Yes    Pain Score 1     Pain Location Knee    Pain Orientation Right    Pain Descriptors / Indicators Aching    Pain Onset 1 to 4 weeks ago                               [facility] Adult PT Treatment/Exercise - 01/31/21  0001       Knee/Hip Exercises: Stretches   Passive Hamstring Stretch Right;30 seconds;5 reps    Passive Hamstring Stretch Limitations supine with overpressure at distal thigh    Hip Flexor Stretch Standing    Hip Flexor Stretch Limitations 30s3x with UE support   Piriformis Stretch Right;3 reps;30 seconds    Piriformis Stretch Limitations figure-4    Other Knee/Hip Stretches stair step flexion stretch 10x 10s      Knee/Hip Exercises: Aerobic   Nustep L7 x 8 min; LEs only      Knee/Hip Exercises: Seated   Long Arc Quad Right;3 sets;10 reps;Weights    Long Arc Quad Weight 10 lbs.    Sit to Sand 3 sets;10 reps   staggered stance, L leg back                      Manual Therapy   Manual Therapy Joint mobilization    Manual therapy comments TKE grade III tibial ER mob  PT Education - 01/31/21 0931     Education Details anatomy, exercise progression, DOMS expectations, muscle firing, gait pattern, ROM needs/deficits, HEP, POC    Person(s) Educated Patient;Spouse    Methods Explanation;Demonstration;Tactile cues;Verbal cues    Comprehension Verbalized understanding;Returned demonstration;Verbal cues required;Tactile cues required              PT Short Term Goals - 01/17/21 0915       PT SHORT TERM GOAL #1   Title Independent with initial HEP    Status Achieved      PT SHORT TERM GOAL #2   Title Rt knee AROM improved -8-95 for improved function    Status On-going               PT Long Term Goals - 01/08/21 1509       PT LONG TERM GOAL #1   Title Independent with final HEP    Time 8    Period Weeks    Status On-going    Target Date 02/25/21      PT LONG TERM GOAL #2   Title Rt knee AROM improved -5-105 for improved function    Time 8    Period Weeks    Status On-going      PT LONG TERM GOAL #3   Title Amb independently without significant deviations for improved function    Time 8    Period Weeks    Status On-going       PT LONG TERM GOAL #4   Title Report pain < 2/10 for improved function    Time 8    Period Weeks    Status On-going      PT LONG TERM GOAL #5   Title FOTO score improved to 53 for improved function    Time 8    Period Weeks    Status On-going                   Plan - 01/31/21 0933     Clinical Impression Statement Pt presented with increased soreness and stiffnes at beginning of today's session likely due to irritation of knee last night while on slippery surface. Pt with improved TKE following knee extension mobilization and was able to to continue with R knee strengthening. Started at 94 deg flex and -12 deg ext at beginning and ended with -2 deg ext with 108 flexion following exercise. Pt required VC and TC for TKE and equal WB during LAQ and gait. Pt able to progress to STS with emphasis on R LE WB. Pt would benefit from continued skilled therapy in order to reach goals and maximize functional R LE strength and ROM for full return to PLOF.    PT Treatment/Interventions ADLs/Self Care Home Management;Aquatic Therapy;Canalith Repostioning;Cryotherapy;Electrical Stimulation;Moist Heat;Balance training;Therapeutic exercise;Therapeutic activities;Functional mobility training;Stair training;Gait training;DME Instruction;Neuromuscular re-education;Patient/family education;Manual techniques;Vestibular;Vasopneumatic Device;Taping;Dry needling;Passive range of motion    PT Next Visit Plan Continued extension training in passive and active movement, static balance intervention.; continue TKE focus; functional strength and balance; maintain flexion;    PT Home Exercise Plan Access Code: [email or web address]    Consulted and Agree with Plan of Care Patient             Patient will benefit from skilled therapeutic intervention in order to improve the following deficits and impairments:  Abnormal gait, Increased edema, Decreased scar mobility, Decreased knowledge of use of DME, Decreased activity  tolerance, Decreased mobility, Difficulty walking, Decreased balance, Decreased strength, Pain, Decreased range of  motion, Dizziness  Visit Diagnosis: Acute pain of right knee  Stiffness of right knee, not elsewhere classified  Other abnormalities of gait and mobility  Muscle weakness (generalized)     Problem List Patient Active Problem List   Diagnosis Date Noted   Arthritis of right knee 12/13/2020   Unilateral primary osteoarthritis, right knee     Zebedee Iba PT, DPT 01/31/21 12:00 PM   [facility] Physical Therapy [address] [place], [place], [address] Phone: [phone]   Fax:  [phone]  Name: Sally Hancock MRN: [id number] Date of Birth: [birth year]

## 2021-02-04 ENCOUNTER — Ambulatory Visit (INDEPENDENT_AMBULATORY_CARE_PROVIDER_SITE_OTHER): Payer: No Typology Code available for payment source | Admitting: Physical Therapy

## 2021-02-04 ENCOUNTER — Other Ambulatory Visit: Payer: Self-pay

## 2021-02-04 DIAGNOSIS — M6281 Muscle weakness (generalized): Secondary | ICD-10-CM | POA: Diagnosis not present

## 2021-02-04 DIAGNOSIS — R2689 Other abnormalities of gait and mobility: Secondary | ICD-10-CM | POA: Diagnosis not present

## 2021-02-04 DIAGNOSIS — R600 Localized edema: Secondary | ICD-10-CM

## 2021-02-04 DIAGNOSIS — R2681 Unsteadiness on feet: Secondary | ICD-10-CM

## 2021-02-04 DIAGNOSIS — M25661 Stiffness of right knee, not elsewhere classified: Secondary | ICD-10-CM

## 2021-02-04 DIAGNOSIS — R42 Dizziness and giddiness: Secondary | ICD-10-CM

## 2021-02-04 DIAGNOSIS — M25561 Pain in right knee: Secondary | ICD-10-CM

## 2021-02-04 NOTE — Therapy (Signed)
[facility] Physical Therapy [address] [place], [place], [address] Phone: [phone]   Fax:  [phone]  Physical Therapy Treatment  Patient Details  Name: Sally Hancock MRN: [id number] Date of Birth: 1975/09/19 Referring Provider (PT): Persons, [place], [place]   Encounter Date: 02/04/2021   PT End of Session - 02/04/21 1017     Visit Number 12    Number of Visits 24    Date for PT Re-Evaluation 02/25/21    PT Start Time 0930    PT Stop Time 1012    PT Time Calculation (min) 42 min    Activity Tolerance Patient tolerated treatment well;No increased pain    Behavior During Therapy WFL for tasks assessed/performed             Past Medical History:  Diagnosis Date   Arthritis    Chronic bronchitis (HCC)    Fibromyalgia    GERD (gastroesophageal reflux disease)    Headache    migraines   History of hiatal hernia    Trauma    Vaginal Pap smear, abnormal     Past Surgical History:  Procedure Laterality Date   CESAREAN SECTION     CHOLECYSTECTOMY  2012   FRACTURE SURGERY Right 08/2015   acetabulum fx   HIP SURGERY     KNEE SURGERY     TONSILLECTOMY     @ 45 years old   TOTAL KNEE ARTHROPLASTY Right 12/13/2020   Procedure: RIGHT TOTAL KNEE ARTHROPLASTY;  Surgeon: Nadara Mustard, MD;  Location: [facility] OR;  Service: Orthopedics;  Laterality: Right;    There were no vitals filed for this visit.   Subjective Assessment - 02/04/21 0933     Subjective went swimming and knee is pretty painful and sore today.    Pertinent History arthritis, fibromyalgia    Patient Stated Goals regain mobility, improve motion in knee, walk better    Currently in Pain? Yes    Pain Score 7     Pain Location Knee    Pain Orientation Right    Pain Descriptors / Indicators Aching    Pain Type Acute pain;Surgical pain    Pain Onset 1 to 4 weeks ago    Pain Frequency Constant    Aggravating Factors  extension, walking    Pain Relieving Factors meds                                [facility] Adult PT Treatment/Exercise - 02/04/21 0935       Knee/Hip Exercises: Stretches   Passive Hamstring Stretch Right;30 seconds;5 reps    Passive Hamstring Stretch Limitations supine with overpressure at distal thigh    Piriformis Stretch Right;30 seconds;4 reps    Piriformis Stretch Limitations figure-4      Knee/Hip Exercises: Aerobic   Nustep L5 x 4 min; L7 x 4 min; LEs only      Knee/Hip Exercises: Standing   Functional Squat Limitations TRX 3x10 - Lt heel raise intermittently      Knee/Hip Exercises: Seated   Long Arc Quad Right;3 sets;10 reps;Weights    Long Arc Quad Weight 4 lbs.   less weight due to increased pain today     Knee/Hip Exercises: Supine   Heel Prop for Knee Extension Weight    Heel Prop for Knee Extension Weight (lbs) 4  PT Short Term Goals - 01/17/21 0915       PT SHORT TERM GOAL #1   Title Independent with initial HEP    Status Achieved      PT SHORT TERM GOAL #2   Title Rt knee AROM improved -8-95 for improved function    Status On-going               PT Long Term Goals - 01/08/21 1509       PT LONG TERM GOAL #1   Title Independent with final HEP    Time 8    Period Weeks    Status On-going    Target Date 02/25/21      PT LONG TERM GOAL #2   Title Rt knee AROM improved -5-105 for improved function    Time 8    Period Weeks    Status On-going      PT LONG TERM GOAL #3   Title Amb independently without significant deviations for improved function    Time 8    Period Weeks    Status On-going      PT LONG TERM GOAL #4   Title Report pain < 2/10 for improved function    Time 8    Period Weeks    Status On-going      PT LONG TERM GOAL #5   Title FOTO score improved to 53 for improved function    Time 8    Period Weeks    Status On-going                   Plan - 02/04/21 1018     Clinical Impression Statement Pt arrived today with  reports of increased pain due to swimming over the weekend so session modified today.  Still has difficulty with TKE which improves with exercises.  Will continue to benefit from PT to maximize function.    PT Treatment/Interventions ADLs/Self Care Home Management;Aquatic Therapy;Canalith Repostioning;Cryotherapy;Electrical Stimulation;Moist Heat;Balance training;Therapeutic exercise;Therapeutic activities;Functional mobility training;Stair training;Gait training;DME Instruction;Neuromuscular re-education;Patient/family education;Manual techniques;Vestibular;Vasopneumatic Device;Taping;Dry needling;Passive range of motion    PT Next Visit Plan Continued extension training in passive and active movement, static balance intervention.; continue TKE focus; functional strength and balance; maintain flexion; needs MD note    PT Home Exercise Plan Access Code: [email or web address]    Consulted and Agree with Plan of Care Patient             Patient will benefit from skilled therapeutic intervention in order to improve the following deficits and impairments:  Abnormal gait, Increased edema, Decreased scar mobility, Decreased knowledge of use of DME, Decreased activity tolerance, Decreased mobility, Difficulty walking, Decreased balance, Decreased strength, Pain, Decreased range of motion, Dizziness  Visit Diagnosis: Acute pain of right knee  Stiffness of right knee, not elsewhere classified  Other abnormalities of gait and mobility  Muscle weakness (generalized)  Dizziness and giddiness  Unsteadiness on feet  Localized edema     Problem List Patient Active Problem List   Diagnosis Date Noted   Arthritis of right knee 12/13/2020   Unilateral primary osteoarthritis, right knee       Clarita Crane, PT, DPT 02/04/21 10:20 AM    [facility] Physical Therapy [address] [place], [place], [address] Phone: [phone]   Fax:  [phone]  Name: Sally Hancock MRN:  [id number] Date of Birth: Jan 08, 1976

## 2021-02-06 ENCOUNTER — Ambulatory Visit (INDEPENDENT_AMBULATORY_CARE_PROVIDER_SITE_OTHER): Payer: No Typology Code available for payment source | Admitting: Physical Therapy

## 2021-02-06 ENCOUNTER — Other Ambulatory Visit: Payer: Self-pay

## 2021-02-06 ENCOUNTER — Encounter: Payer: Self-pay | Admitting: Physical Therapy

## 2021-02-06 DIAGNOSIS — R2689 Other abnormalities of gait and mobility: Secondary | ICD-10-CM

## 2021-02-06 DIAGNOSIS — M25561 Pain in right knee: Secondary | ICD-10-CM

## 2021-02-06 DIAGNOSIS — R42 Dizziness and giddiness: Secondary | ICD-10-CM

## 2021-02-06 DIAGNOSIS — M6281 Muscle weakness (generalized): Secondary | ICD-10-CM

## 2021-02-06 DIAGNOSIS — R600 Localized edema: Secondary | ICD-10-CM

## 2021-02-06 DIAGNOSIS — M25661 Stiffness of right knee, not elsewhere classified: Secondary | ICD-10-CM

## 2021-02-06 DIAGNOSIS — R2681 Unsteadiness on feet: Secondary | ICD-10-CM

## 2021-02-06 NOTE — Therapy (Signed)
[facility] Physical Therapy [address] [place], [place], [address] Phone: [phone]   Fax:  [phone]  Physical Therapy Treatment  Patient Details  Name: Sally Hancock MRN: [id number] Date of Birth: 1976/02/25 Referring Provider (PT): Persons, [place], [place]   Encounter Date: 02/06/2021   PT End of Session - 02/06/21 1006     Visit Number 13    Number of Visits 24    Date for PT Re-Evaluation 02/25/21    PT Start Time 0920    PT Stop Time 1003    PT Time Calculation (min) 43 min    Activity Tolerance Patient tolerated treatment well;No increased pain    Behavior During Therapy WFL for tasks assessed/performed             Past Medical History:  Diagnosis Date   Arthritis    Chronic bronchitis (HCC)    Fibromyalgia    GERD (gastroesophageal reflux disease)    Headache    migraines   History of hiatal hernia    Trauma    Vaginal Pap smear, abnormal     Past Surgical History:  Procedure Laterality Date   CESAREAN SECTION     CHOLECYSTECTOMY  2012   FRACTURE SURGERY Right 08/2015   acetabulum fx   HIP SURGERY     KNEE SURGERY     TONSILLECTOMY     @ 45 years old   TOTAL KNEE ARTHROPLASTY Right 12/13/2020   Procedure: RIGHT TOTAL KNEE ARTHROPLASTY;  Surgeon: Nadara Mustard, MD;  Location: [facility] OR;  Service: Orthopedics;  Laterality: Right;    There were no vitals filed for this visit.   Subjective Assessment - 02/06/21 0924     Subjective knee is sore today, hip and back are more painful today rated 8/10    Pertinent History arthritis, fibromyalgia    Patient Stated Goals regain mobility, improve motion in knee, walk better    Currently in Pain? Yes    Pain Score 5     Pain Location Knee    Pain Orientation Right    Pain Descriptors / Indicators Aching    Pain Type Acute pain;Surgical pain    Pain Onset 1 to 4 weeks ago    Pain Frequency Constant    Aggravating Factors  walking, swimming    Pain Relieving Factors meds                 [facility] PT Assessment - 02/06/21 0949       Assessment   Medical Diagnosis Z96.651 (ICD-10-CM) - Status post right knee replacement    Referring Provider (PT) Persons, [place], [place]    Onset Date/Surgical Date 12/13/20    Next MD Visit 02/07/21      AROM   Right Knee Extension -6    Right Knee Flexion 112                           [facility] Adult PT Treatment/Exercise - 02/06/21 0924       Knee/Hip Exercises: Stretches   Passive Hamstring Stretch Right;30 seconds;3 reps    Passive Hamstring Stretch Limitations supine with overpressure at distal thigh    Quad Stretch Right;3 reps;30 seconds    Quad Stretch Limitations prone with strap    Piriformis Stretch Right;3 reps;30 seconds    Piriformis Stretch Limitations figure-4      Knee/Hip Exercises: Aerobic   Recumbent Bike L3 x 8 min  Knee/Hip Exercises: Machines for Strengthening   Cybex Knee Extension RLE only 2x15; 10#    Cybex Knee Flexion RLE only 2x15; 15#      Knee/Hip Exercises: Seated   Sit to Sand 3 sets;10 reps      Knee/Hip Exercises: Prone   Hamstring Curl 3 sets;10 reps   2#; right   Other Prone Exercises quad set x 20 reps; Rt                      PT Short Term Goals - 02/06/21 1007       PT SHORT TERM GOAL #1   Title Independent with initial HEP    Status Achieved      PT SHORT TERM GOAL #2   Title Rt knee AROM improved -8-95 for improved function    Status Achieved               PT Long Term Goals - 01/08/21 1509       PT LONG TERM GOAL #1   Title Independent with final HEP    Time 8    Period Weeks    Status On-going    Target Date 02/25/21      PT LONG TERM GOAL #2   Title Rt knee AROM improved -5-105 for improved function    Time 8    Period Weeks    Status On-going      PT LONG TERM GOAL #3   Title Amb independently without significant deviations for improved function    Time 8    Period Weeks    Status On-going      PT LONG  TERM GOAL #4   Title Report pain < 2/10 for improved function    Time 8    Period Weeks    Status On-going      PT LONG TERM GOAL #5   Title FOTO score improved to 53 for improved function    Time 8    Period Weeks    Status On-going                   Plan - 02/06/21 1007     Clinical Impression Statement Continued progress with ROM noted today and overall demonstrating improvement in functional mobility.  Back and hip pain also limiting progress.  Will continue to benefit from PT to maximize function.    PT Treatment/Interventions ADLs/Self Care Home Management;Aquatic Therapy;Canalith Repostioning;Cryotherapy;Electrical Stimulation;Moist Heat;Balance training;Therapeutic exercise;Therapeutic activities;Functional mobility training;Stair training;Gait training;DME Instruction;Neuromuscular re-education;Patient/family education;Manual techniques;Vestibular;Vasopneumatic Device;Taping;Dry needling;Passive range of motion    PT Next Visit Plan Continued extension training in passive and active movement, static balance intervention.; continue TKE focus; functional strength and balance; maintain flexion    PT Home Exercise Plan Access Code: [email or web address]    Consulted and Agree with Plan of Care Patient             Patient will benefit from skilled therapeutic intervention in order to improve the following deficits and impairments:  Abnormal gait, Increased edema, Decreased scar mobility, Decreased knowledge of use of DME, Decreased activity tolerance, Decreased mobility, Difficulty walking, Decreased balance, Decreased strength, Pain, Decreased range of motion, Dizziness  Visit Diagnosis: Acute pain of right knee  Stiffness of right knee, not elsewhere classified  Other abnormalities of gait and mobility  Muscle weakness (generalized)  Dizziness and giddiness  Unsteadiness on feet  Localized edema     Problem List Patient Active Problem List   Diagnosis Date Noted  Arthritis of right knee 12/13/2020   Unilateral primary osteoarthritis, right knee       Clarita Crane, PT, DPT 02/06/21 10:08 AM      [facility] Physical Therapy [address] [place], [place], [address] Phone: [phone]   Fax:  [phone]  Name: Sally Hancock MRN: [id number] Date of Birth: 1975-10-06

## 2021-02-07 ENCOUNTER — Encounter: Payer: Self-pay | Admitting: Family

## 2021-02-07 ENCOUNTER — Ambulatory Visit (INDEPENDENT_AMBULATORY_CARE_PROVIDER_SITE_OTHER): Payer: No Typology Code available for payment source | Admitting: Family

## 2021-02-07 DIAGNOSIS — Z96651 Presence of right artificial knee joint: Secondary | ICD-10-CM

## 2021-02-07 NOTE — Progress Notes (Signed)
   Post-Op Visit Note   Patient: Sally Hancock           Date of Birth: [birth year]           MRN: [id number] Visit Date: 02/07/2021 PCP: Galvin Proffer, MD  Chief Complaint:  Chief Complaint  Patient presents with   Right Knee - Follow-up    HPI:  HPI The the patient is a 45 year old woman seen in follow-up she had been having issues with a suture abscess of her right total knee.  Today she is quite pleased with her healing she has not had any drainage she is not having erythema very mild tenderness over her proximal incision.  Ortho Exam On examination of the right knee, the incision is well-healed she has excellent range of motion.  There is no open area no drainage no erythema or warmth.  No palpable fluctuance  Visit Diagnoses:  No diagnosis found.   Plan: She plans to follow-up once more in 4 weeks after the completion of her physical therapy.  She feels she cannot currently stand for 12-hour shifts we will continue her out of work and reevaluate in 4 weeks. Follow-Up Instructions: No follow-ups on file.   Imaging: No results found.  Orders:  No orders of the defined types were placed in this encounter.  No orders of the defined types were placed in this encounter.    PMFS History: Patient Active Problem List   Diagnosis Date Noted   Arthritis of right knee 12/13/2020   Unilateral primary osteoarthritis, right knee    Past Medical History:  Diagnosis Date   Arthritis    Chronic bronchitis (HCC)    Fibromyalgia    GERD (gastroesophageal reflux disease)    Headache    migraines   History of hiatal hernia    Trauma    Vaginal Pap smear, abnormal     Family History  Problem Relation Age of Onset   Breast cancer Maternal Aunt     Past Surgical History:  Procedure Laterality Date   CESAREAN SECTION     CHOLECYSTECTOMY  2012   FRACTURE SURGERY Right 08/2015   acetabulum fx   HIP SURGERY     KNEE SURGERY     TONSILLECTOMY     @ 45 years old   TOTAL  KNEE ARTHROPLASTY Right 12/13/2020   Procedure: RIGHT TOTAL KNEE ARTHROPLASTY;  Surgeon: Nadara Mustard, MD;  Location: [facility];  Service: Orthopedics;  Laterality: Right;   Social History   Occupational History   Not on file  Tobacco Use   Smoking status: Every Day    Packs/day: 0.50    Years: 5.00    Pack years: 2.50    Types: Cigarettes   Smokeless tobacco: Never  Vaping Use   Vaping Use: Some days  Substance and Sexual Activity   Alcohol use: Never   Drug use: Never   Sexual activity: Not Currently    Birth control/protection: I.U.D.

## 2021-02-12 ENCOUNTER — Encounter: Payer: Self-pay | Admitting: Physical Therapy

## 2021-02-12 ENCOUNTER — Ambulatory Visit (INDEPENDENT_AMBULATORY_CARE_PROVIDER_SITE_OTHER): Payer: No Typology Code available for payment source | Admitting: Physical Therapy

## 2021-02-12 ENCOUNTER — Other Ambulatory Visit: Payer: Self-pay

## 2021-02-12 DIAGNOSIS — M6281 Muscle weakness (generalized): Secondary | ICD-10-CM

## 2021-02-12 DIAGNOSIS — M25661 Stiffness of right knee, not elsewhere classified: Secondary | ICD-10-CM | POA: Diagnosis not present

## 2021-02-12 DIAGNOSIS — R600 Localized edema: Secondary | ICD-10-CM

## 2021-02-12 DIAGNOSIS — M25561 Pain in right knee: Secondary | ICD-10-CM | POA: Diagnosis not present

## 2021-02-12 DIAGNOSIS — R2681 Unsteadiness on feet: Secondary | ICD-10-CM

## 2021-02-12 DIAGNOSIS — R42 Dizziness and giddiness: Secondary | ICD-10-CM

## 2021-02-12 DIAGNOSIS — R2689 Other abnormalities of gait and mobility: Secondary | ICD-10-CM

## 2021-02-12 NOTE — Therapy (Signed)
[facility] [address] [place], [place], [address] Phone: [phone]   Fax:  [phone]  Physical Therapy Treatment  Patient Details  Name: Sally Hancock MRN: [id number] Date of Birth: [birth year] Referring Provider (PT): Persons, [place], [place]   Encounter Date: 02/12/2021   PT End of Session - 02/12/21 1547     Visit Number 14    Number of Visits 24    Date for PT Re-Evaluation 02/25/21    PT Start Time 1510    PT Stop Time 1555    PT Time Calculation (min) 45 min    Activity Tolerance Patient tolerated treatment well;No increased pain    Behavior During Therapy WFL for tasks assessed/performed             Past Medical History:  Diagnosis Date   Arthritis    Chronic bronchitis (HCC)    Fibromyalgia    GERD (gastroesophageal reflux disease)    Headache    migraines   History of hiatal hernia    Trauma    Vaginal Pap smear, abnormal     Past Surgical History:  Procedure Laterality Date   CESAREAN SECTION     CHOLECYSTECTOMY  2012   FRACTURE SURGERY Right 08/2015   acetabulum fx   HIP SURGERY     KNEE SURGERY     TONSILLECTOMY     @ 45 years old   TOTAL KNEE ARTHROPLASTY Right 12/13/2020   Procedure: RIGHT TOTAL KNEE ARTHROPLASTY;  Surgeon: Nadara Mustard, MD;  Location: [facility];  Service: Orthopedics;  Laterality: Right;    There were no vitals filed for this visit.   Subjective Assessment - 02/12/21 1510     Subjective knee is sore today, going up/down steps reciprocally and had some increased soreness    Pertinent History arthritis, fibromyalgia    Patient Stated Goals regain mobility, improve motion in knee, walk better    Currently in Pain? Yes    Pain Score 3     Pain Location Knee    Pain Orientation Right    Pain Descriptors / Indicators Aching    Pain Type Acute pain;Surgical pain    Pain Onset 1 to 4 weeks ago    Pain Frequency Constant    Aggravating Factors  walking, swimming    Pain Relieving  Factors meds                               [facility] Adult PT Treatment/Exercise - 02/12/21 1511       Knee/Hip Exercises: Stretches   Passive Hamstring Stretch Right;30 seconds;3 reps    Passive Hamstring Stretch Limitations supine with overpressure at distal thigh      Knee/Hip Exercises: Aerobic   Recumbent Bike L4 x 8 min      Knee/Hip Exercises: Machines for Strengthening   Total Gym Leg Press 125# 3x10 bil push; RLE only 50# x 10; 56# 2x10      Knee/Hip Exercises: Seated   Long Arc Quad Right;3 sets;10 reps;Weights    Long Arc Quad Weight 5 lbs.    Other Seated Knee/Hip Exercises seated SLR 3x10; x 10 with 5#; then 2.5# 2x10      Knee/Hip Exercises: Supine   Quad Sets Right;20 reps    Quad Sets Limitations with heel prop    Heel Prop for Knee Extension Weight;3 minutes    Heel Prop for Knee Extension Weight (lbs) 5  Vasopneumatic   Number Minutes Vasopneumatic  10 minutes    Vasopnuematic Location  Knee    Vasopneumatic Pressure Medium    Vasopneumatic Temperature  34                      PT Short Term Goals - 02/06/21 1007       PT SHORT TERM GOAL #1   Title Independent with initial HEP    Status Achieved      PT SHORT TERM GOAL #2   Title Rt knee AROM improved -8-95 for improved function    Status Achieved               PT Long Term Goals - 01/08/21 1509       PT LONG TERM GOAL #1   Title Independent with final HEP    Time 8    Period Weeks    Status On-going    Target Date 02/25/21      PT LONG TERM GOAL #2   Title Rt knee AROM improved -5-105 for improved function    Time 8    Period Weeks    Status On-going      PT LONG TERM GOAL #3   Title Amb independently without significant deviations for improved function    Time 8    Period Weeks    Status On-going      PT LONG TERM GOAL #4   Title Report pain < 2/10 for improved function    Time 8    Period Weeks    Status On-going      PT LONG TERM  GOAL #5   Title FOTO score improved to 53 for improved function    Time 8    Period Weeks    Status On-going                   Plan - 02/12/21 1547     Clinical Impression Statement Pt increasing activity at home resulting in some increased swelling and tightness in Rt knee today.  Overall continues to progress functional mobility and strength, and will continue to benefit from PT to maximize function.    PT Treatment/Interventions ADLs/Self Care Home Management;Aquatic Therapy;Canalith Repostioning;Cryotherapy;Electrical Stimulation;Moist Heat;Balance training;Therapeutic exercise;Therapeutic activities;Functional mobility training;Stair training;Gait training;DME Instruction;Neuromuscular re-education;Patient/family education;Manual techniques;Vestibular;Vasopneumatic Device;Taping;Dry needling;Passive range of motion    PT Next Visit Plan Continued extension training in passive and active movement, static balance intervention.; continue TKE focus; functional strength and balance; maintain flexion    PT Home Exercise Plan Access Code: [email or web address]    Consulted and Agree with Plan of Care Patient             Patient will benefit from skilled therapeutic intervention in order to improve the following deficits and impairments:  Abnormal gait, Increased edema, Decreased scar mobility, Decreased knowledge of use of DME, Decreased activity tolerance, Decreased mobility, Difficulty walking, Decreased balance, Decreased strength, Pain, Decreased range of motion, Dizziness  Visit Diagnosis: Acute pain of right knee  Stiffness of right knee, not elsewhere classified  Other abnormalities of gait and mobility  Muscle weakness (generalized)  Dizziness and giddiness  Unsteadiness on feet  Localized edema     Problem List Patient Active Problem List   Diagnosis Date Noted   Arthritis of right knee 12/13/2020   Unilateral primary osteoarthritis, right knee       Clarita Crane, PT, DPT 02/12/21 3:49 PM      [facility] [address] [place], [place], [address] Phone: [phone]   Fax:  [phone]  Name: Sally Hancock MRN: [id number] Date of Birth: 1976-04-12

## 2021-02-13 ENCOUNTER — Ambulatory Visit (INDEPENDENT_AMBULATORY_CARE_PROVIDER_SITE_OTHER): Payer: No Typology Code available for payment source | Admitting: Physical Therapy

## 2021-02-13 ENCOUNTER — Encounter: Payer: Self-pay | Admitting: Physical Therapy

## 2021-02-13 ENCOUNTER — Telehealth: Payer: Self-pay | Admitting: Family

## 2021-02-13 DIAGNOSIS — R2689 Other abnormalities of gait and mobility: Secondary | ICD-10-CM | POA: Diagnosis not present

## 2021-02-13 DIAGNOSIS — R42 Dizziness and giddiness: Secondary | ICD-10-CM

## 2021-02-13 DIAGNOSIS — M6281 Muscle weakness (generalized): Secondary | ICD-10-CM | POA: Diagnosis not present

## 2021-02-13 DIAGNOSIS — R2681 Unsteadiness on feet: Secondary | ICD-10-CM

## 2021-02-13 DIAGNOSIS — M25661 Stiffness of right knee, not elsewhere classified: Secondary | ICD-10-CM

## 2021-02-13 DIAGNOSIS — R600 Localized edema: Secondary | ICD-10-CM

## 2021-02-13 DIAGNOSIS — M25561 Pain in right knee: Secondary | ICD-10-CM

## 2021-02-13 NOTE — Therapy (Signed)
[facility] [address] [place], [place], [address] Phone: [phone]   Fax:  [phone]  Physical Therapy Treatment  Patient Details  Name: Sally Hancock MRN: [id number] Date of Birth: 1976-03-31 Referring Provider (PT): Persons, [place], [place]   Encounter Date: 02/13/2021   PT End of Session - 02/13/21 1300     Visit Number 15    Number of Visits 24    Date for PT Re-Evaluation 02/25/21    PT Start Time 1257    PT Stop Time 1337    PT Time Calculation (min) 40 min    Activity Tolerance Patient tolerated treatment well;No increased pain    Behavior During Therapy WFL for tasks assessed/performed             Past Medical History:  Diagnosis Date   Arthritis    Chronic bronchitis (HCC)    Fibromyalgia    GERD (gastroesophageal reflux disease)    Headache    migraines   History of hiatal hernia    Trauma    Vaginal Pap smear, abnormal     Past Surgical History:  Procedure Laterality Date   CESAREAN SECTION     CHOLECYSTECTOMY  2012   FRACTURE SURGERY Right 08/2015   acetabulum fx   HIP SURGERY     KNEE SURGERY     TONSILLECTOMY     @ 45 years old   TOTAL KNEE ARTHROPLASTY Right 12/13/2020   Procedure: RIGHT TOTAL KNEE ARTHROPLASTY;  Surgeon: Nadara Mustard, MD;  Location: [facility];  Service: Orthopedics;  Laterality: Right;    There were no vitals filed for this visit.   Subjective Assessment - 02/13/21 1257     Subjective knee is more stiff than pain now    Pertinent History arthritis, fibromyalgia    Patient Stated Goals regain mobility, improve motion in knee, walk better    Currently in Pain? Yes    Pain Score 2     Pain Location Knee    Pain Orientation Right    Pain Descriptors / Indicators Aching;Tightness    Pain Type Acute pain;Surgical pain    Pain Onset 1 to 4 weeks ago    Pain Frequency Constant    Aggravating Factors  walking, swimming    Pain Relieving Factors meds                                [facility] Adult PT Treatment/Exercise - 02/13/21 1300       Knee/Hip Exercises: Stretches   Passive Hamstring Stretch Right;30 seconds;3 reps    Passive Hamstring Stretch Limitations supine with overpressure at distal thigh    Hip Flexor Stretch Right;3 reps;30 seconds    Hip Flexor Stretch Limitations supine with strap for quad stretch      Knee/Hip Exercises: Aerobic   Recumbent Bike L4 x 8 min      Knee/Hip Exercises: Machines for Strengthening   Cybex Knee Extension RLE only 2x15; 10#    Cybex Knee Flexion RLE only 2x15; 20#    Total Gym Leg Press 125# 3x10 bil push; RLE only 56# 3 x 10      Knee/Hip Exercises: Standing   Other Standing Knee Exercises RDL 10# KB 3x10      Manual Therapy   Manual therapy comments supine extension Rt knee  PT Short Term Goals - 02/06/21 1007       PT SHORT TERM GOAL #1   Title Independent with initial HEP    Status Achieved      PT SHORT TERM GOAL #2   Title Rt knee AROM improved -8-95 for improved function    Status Achieved               PT Long Term Goals - 01/08/21 1509       PT LONG TERM GOAL #1   Title Independent with final HEP    Time 8    Period Weeks    Status On-going    Target Date 02/25/21      PT LONG TERM GOAL #2   Title Rt knee AROM improved -5-105 for improved function    Time 8    Period Weeks    Status On-going      PT LONG TERM GOAL #3   Title Amb independently without significant deviations for improved function    Time 8    Period Weeks    Status On-going      PT LONG TERM GOAL #4   Title Report pain < 2/10 for improved function    Time 8    Period Weeks    Status On-going      PT LONG TERM GOAL #5   Title FOTO score improved to 53 for improved function    Time 8    Period Weeks    Status On-going                   Plan - 02/13/21 1337     Clinical Impression Statement Continued focus on strengthening  activities today with good tolerance.   Still has ROM limitaions and working on at home and in clinic.  Will continue to benefit from PT to maximize functoin.    PT Treatment/Interventions ADLs/Self Care Home Management;Aquatic Therapy;Canalith Repostioning;Cryotherapy;Electrical Stimulation;Moist Heat;Balance training;Therapeutic exercise;Therapeutic activities;Functional mobility training;Stair training;Gait training;DME Instruction;Neuromuscular re-education;Patient/family education;Manual techniques;Vestibular;Vasopneumatic Device;Taping;Dry needling;Passive range of motion    PT Next Visit Plan Continued extension training in passive and active movement, static balance intervention.; continue TKE focus; functional strength and balance; maintain flexion    PT Home Exercise Plan Access Code: [email or web address]    Consulted and Agree with Plan of Care Patient             Patient will benefit from skilled therapeutic intervention in order to improve the following deficits and impairments:  Abnormal gait, Increased edema, Decreased scar mobility, Decreased knowledge of use of DME, Decreased activity tolerance, Decreased mobility, Difficulty walking, Decreased balance, Decreased strength, Pain, Decreased range of motion, Dizziness  Visit Diagnosis: Acute pain of right knee  Stiffness of right knee, not elsewhere classified  Other abnormalities of gait and mobility  Muscle weakness (generalized)  Dizziness and giddiness  Unsteadiness on feet  Localized edema     Problem List Patient Active Problem List   Diagnosis Date Noted   Arthritis of right knee 12/13/2020   Unilateral primary osteoarthritis, right knee       Clarita Crane, PT, DPT 02/13/21 1:40 PM     [facility] [address] [place], [place], [address] Phone: [phone]   Fax:  [phone]  Name: Sally Hancock MRN: [id number] Date of Birth: Dec 06, 1975

## 2021-02-13 NOTE — Telephone Encounter (Signed)
Received medical records release form from patient/ Forwarding to [employer] today 

## 2021-02-18 ENCOUNTER — Encounter: Payer: No Typology Code available for payment source | Admitting: Physical Therapy

## 2021-02-19 ENCOUNTER — Encounter: Payer: Self-pay | Admitting: Physical Therapy

## 2021-02-19 ENCOUNTER — Other Ambulatory Visit: Payer: Self-pay

## 2021-02-19 ENCOUNTER — Ambulatory Visit (INDEPENDENT_AMBULATORY_CARE_PROVIDER_SITE_OTHER): Payer: No Typology Code available for payment source | Admitting: Physical Therapy

## 2021-02-19 DIAGNOSIS — M6281 Muscle weakness (generalized): Secondary | ICD-10-CM | POA: Diagnosis not present

## 2021-02-19 DIAGNOSIS — R2681 Unsteadiness on feet: Secondary | ICD-10-CM

## 2021-02-19 DIAGNOSIS — M25561 Pain in right knee: Secondary | ICD-10-CM

## 2021-02-19 DIAGNOSIS — M25661 Stiffness of right knee, not elsewhere classified: Secondary | ICD-10-CM

## 2021-02-19 DIAGNOSIS — R2689 Other abnormalities of gait and mobility: Secondary | ICD-10-CM | POA: Diagnosis not present

## 2021-02-19 DIAGNOSIS — R600 Localized edema: Secondary | ICD-10-CM

## 2021-02-19 DIAGNOSIS — R42 Dizziness and giddiness: Secondary | ICD-10-CM

## 2021-02-19 NOTE — Therapy (Signed)
[facility] [address] [place], [place], [address] Phone: [phone]   Fax:  [phone]  Physical Therapy Treatment/Discharge Summary  Patient Details  Name: Sally Hancock MRN: [id number] Date of Birth: [birth year] Referring Provider (PT): Persons, [place], [place]   Encounter Date: 02/19/2021   PT End of Session - 02/19/21 1047     Visit Number 16    Number of Visits --    Date for PT Re-Evaluation --    PT Start Time 1010    PT Stop Time 1045    PT Time Calculation (min) 35 min    Activity Tolerance Patient tolerated treatment well;No increased pain    Behavior During Therapy WFL for tasks assessed/performed             Past Medical History:  Diagnosis Date   Arthritis    Chronic bronchitis (HCC)    Fibromyalgia    GERD (gastroesophageal reflux disease)    Headache    migraines   History of hiatal hernia    Trauma    Vaginal Pap smear, abnormal     Past Surgical History:  Procedure Laterality Date   CESAREAN SECTION     CHOLECYSTECTOMY  2012   FRACTURE SURGERY Right 08/2015   acetabulum fx   HIP SURGERY     KNEE SURGERY     TONSILLECTOMY     @ 45 years old   TOTAL KNEE ARTHROPLASTY Right 12/13/2020   Procedure: RIGHT TOTAL KNEE ARTHROPLASTY;  Surgeon: Newt Minion, MD;  Location: [facility];  Service: Orthopedics;  Laterality: Right;    There were no vitals filed for this visit.   Subjective Assessment - 02/19/21 1013     Subjective knee is doing pretty well, was in hospital with husband over the weekend so was up on knee quite a bit.  sore after    Pertinent History arthritis, fibromyalgia    Patient Stated Goals regain mobility, improve motion in knee, walk better    Currently in Pain? No/denies                [facility] PT Assessment - 02/19/21 1029       Assessment   Medical Diagnosis Z96.651 (ICD-10-CM) - Status post right knee replacement    Referring Provider (PT) Persons, [place], [place]    Onset  Date/Surgical Date 12/13/20      Observation/Other Assessments   Focus on Therapeutic Outcomes (FOTO)  69      AROM   Right Knee Extension -5    Right Knee Flexion 116      Ambulation/Gait   Ambulation/Gait Yes    Ambulation/Gait Assistance 6: Modified independent (Device/Increase time)    Ambulation/Gait Assistance Details no device; mild deviations as pt does not have full extension, no instability noted    Stairs Yes    Stairs Assistance 6: Modified independent (Device/Increase time)    Stair Management Technique One rail Left;Alternating pattern;Forwards    Number of Stairs 15    Height of Stairs 8                           [facility] Adult PT Treatment/Exercise - 02/19/21 1014       Knee/Hip Exercises: Aerobic   Recumbent Bike L6 x 8 min      Knee/Hip Exercises: Machines for Strengthening   Total Gym Leg Press 125# 3x10 bil push; RLE only 56# 3 x 10  PT Education - 02/19/21 1047     Education Details updated HEP    Person(s) Educated Patient;Spouse    Methods Explanation;Demonstration;Handout    Comprehension Verbalized understanding;Returned demonstration              PT Short Term Goals - 02/06/21 1007       PT SHORT TERM GOAL #1   Title Independent with initial HEP    Status Achieved      PT SHORT TERM GOAL #2   Title Rt knee AROM improved -8-95 for improved function    Status Achieved               PT Long Term Goals - 02/19/21 1047       PT LONG TERM GOAL #1   Title Independent with final HEP    Time 8    Period Weeks    Status Achieved      PT LONG TERM GOAL #2   Title Rt knee AROM improved -5-105 for improved function    Time 8    Period Weeks    Status Achieved      PT LONG TERM GOAL #3   Title Amb independently without significant deviations for improved function    Time 8    Period Weeks    Status Achieved      PT LONG TERM GOAL #4   Title Report pain < 2/10 for improved function     Time 8    Period Weeks    Status Achieved      PT LONG TERM GOAL #5   Title FOTO score improved to 53 for improved function    Time 8    Period Weeks    Status Achieved                   Plan - 02/19/21 1048     Clinical Impression Statement Pt has met all goals and feels ready for d/c at this time.  Will d/c PT today and pt to continue with HEP and gym program.    PT Treatment/Interventions ADLs/Self Care Home Management;Aquatic Therapy;Canalith Repostioning;Cryotherapy;Electrical Stimulation;Moist Heat;Balance training;Therapeutic exercise;Therapeutic activities;Functional mobility training;Stair training;Gait training;DME Instruction;Neuromuscular re-education;Patient/family education;Manual techniques;Vestibular;Vasopneumatic Device;Taping;Dry needling;Passive range of motion    PT Next Visit Plan d/c PT today    PT Home Exercise Plan Access Code: [email or web address]    Consulted and Agree with Plan of Care Patient             Patient will benefit from skilled therapeutic intervention in order to improve the following deficits and impairments:  Abnormal gait, Increased edema, Decreased scar mobility, Decreased knowledge of use of DME, Decreased activity tolerance, Decreased mobility, Difficulty walking, Decreased balance, Decreased strength, Pain, Decreased range of motion, Dizziness  Visit Diagnosis: Acute pain of right knee  Stiffness of right knee, not elsewhere classified  Other abnormalities of gait and mobility  Muscle weakness (generalized)  Dizziness and giddiness  Unsteadiness on feet  Localized edema     Problem List Patient Active Problem List   Diagnosis Date Noted   Arthritis of right knee 12/13/2020   Unilateral primary osteoarthritis, right knee       Laureen Abrahams, PT, DPT 02/19/21 10:50 AM     [facility] [address] [place], [place], [address] Phone: [phone]   Fax:   [phone]  Name: Sally Hancock MRN: [id number] Date of Birth: 1975-10-21     PHYSICAL THERAPY DISCHARGE SUMMARY  Visits from [facility]  of Care: 16  Current functional level related to goals / functional outcomes: See above    Remaining deficits: See above   Education / Equipment: HEP   Patient agrees to discharge. Patient goals were met. Patient is being discharged due to meeting the stated rehab goals.   Laureen Abrahams, PT, DPT 02/19/21 10:50 AM  [facility] [address] [place], [place], [address] Phone: [phone]   Fax:  [phone]

## 2021-02-19 NOTE — Patient Instructions (Signed)
Access Code: [email or web address] URL: [email or web address] Date: 02/19/2021 Prepared by: Moshe Cipro  Exercises Supine Quad Set - 4-5 x daily - 7 x weekly - 1-2 sets - 10 reps - 5 sec hold Supine Knee Extension Mobilization with Weight - 1 x daily - 7 x weekly - 3 sets - 10 reps Seated Knee Flexion AAROM - 4-5 x daily - 7 x weekly - 1-2 sets - 10 reps - 10 sec hold Supine Heel Slide with Strap - 4-5 x daily - 7 x weekly - 1-2 sets - 10 reps - 5 sec hold Full Leg Press - 1 x daily - 2-3 x weekly - 1 sets Single Leg Knee Extension with Weight Machine - 1 x daily - 2-3 x weekly - 3 sets - 10 reps Recumbent Bike - 1 x daily - 2-3 x weekly - 3 sets - 10 reps Eccentric Squat - 1 x daily - 7 x weekly - 3 sets - 10 reps Kettlebell Deadlift - 1 x daily - 7 x weekly - 3 sets - 10 reps

## 2021-02-21 ENCOUNTER — Encounter: Payer: No Typology Code available for payment source | Admitting: Physical Therapy

## 2021-02-24 ENCOUNTER — Encounter: Payer: No Typology Code available for payment source | Admitting: Physical Therapy

## 2021-02-26 ENCOUNTER — Encounter: Payer: No Typology Code available for payment source | Admitting: Physical Therapy

## 2021-03-04 ENCOUNTER — Encounter: Payer: No Typology Code available for payment source | Admitting: Physical Therapy

## 2021-03-07 ENCOUNTER — Encounter: Payer: No Typology Code available for payment source | Admitting: Physical Therapy

## 2021-03-11 ENCOUNTER — Ambulatory Visit (INDEPENDENT_AMBULATORY_CARE_PROVIDER_SITE_OTHER): Payer: No Typology Code available for payment source | Admitting: Family

## 2021-03-11 ENCOUNTER — Other Ambulatory Visit: Payer: Self-pay

## 2021-03-11 ENCOUNTER — Encounter: Payer: Self-pay | Admitting: Family

## 2021-03-11 DIAGNOSIS — M1711 Unilateral primary osteoarthritis, right knee: Secondary | ICD-10-CM

## 2021-03-11 NOTE — Progress Notes (Signed)
   Post-Op Visit Note   Patient: Sally Hancock           Date of Birth: 05/03/1976           MRN: [id number] Visit Date: 03/11/2021 PCP: Galvin Proffer, MD  Chief Complaint:  No chief complaint on file.   HPI:  HPI The the patient is a 45 year old woman seen in follow-up status post right TKA. No issues today. Has completed PT.  Today she is quite pleased with her healing. States is ready to return to work.  Ortho Exam On examination of the right knee, the incision is well-healed. Full extension. Flexion to 70. There is no open area no drainage no erythema or warmth.  No palpable fluctuance  Visit Diagnoses:  1. Unilateral primary osteoarthritis, right knee     Plan: will release to full work duties. Follow up as needed.   Follow-Up Instructions: No follow-ups on file.   Imaging: No results found.  Orders:  No orders of the defined types were placed in this encounter.  No orders of the defined types were placed in this encounter.    PMFS History: Patient Active Problem List   Diagnosis Date Noted   Arthritis of right knee 12/13/2020   Unilateral primary osteoarthritis, right knee    Past Medical History:  Diagnosis Date   Arthritis    Chronic bronchitis (HCC)    Fibromyalgia    GERD (gastroesophageal reflux disease)    Headache    migraines   History of hiatal hernia    Trauma    Vaginal Pap smear, abnormal     Family History  Problem Relation Age of Onset   Breast cancer Maternal Aunt     Past Surgical History:  Procedure Laterality Date   CESAREAN SECTION     CHOLECYSTECTOMY  2012   FRACTURE SURGERY Right 08/2015   acetabulum fx   HIP SURGERY     KNEE SURGERY     TONSILLECTOMY     @ 45 years old   TOTAL KNEE ARTHROPLASTY Right 12/13/2020   Procedure: RIGHT TOTAL KNEE ARTHROPLASTY;  Surgeon: Nadara Mustard, MD;  Location: [facility];  Service: Orthopedics;  Laterality: Right;   Social History   Occupational History   Not on file  Tobacco Use    Smoking status: Every Day    Packs/day: 0.50    Years: 5.00    Pack years: 2.50    Types: Cigarettes   Smokeless tobacco: Never  Vaping Use   Vaping Use: Some days  Substance and Sexual Activity   Alcohol use: Never   Drug use: Never   Sexual activity: Not Currently    Birth control/protection: I.U.D.

## 2021-07-22 ENCOUNTER — Other Ambulatory Visit: Payer: Self-pay

## 2021-07-22 ENCOUNTER — Other Ambulatory Visit (HOSPITAL_BASED_OUTPATIENT_CLINIC_OR_DEPARTMENT_OTHER): Payer: Self-pay | Admitting: Nurse Practitioner

## 2021-07-22 ENCOUNTER — Ambulatory Visit (HOSPITAL_BASED_OUTPATIENT_CLINIC_OR_DEPARTMENT_OTHER)
Admission: RE | Admit: 2021-07-22 | Discharge: 2021-07-22 | Disposition: A | Discharge status: Home / Self Care | Payer: No Typology Code available for payment source | Source: Ambulatory Visit | Attending: Nurse Practitioner | Admitting: Nurse Practitioner

## 2021-07-22 DIAGNOSIS — R2242 Localized swelling, mass and lump, left lower limb: Secondary | ICD-10-CM

## 2022-10-20 ENCOUNTER — Ambulatory Visit: Payer: Commercial Managed Care - PPO | Admitting: Orthopedic Surgery

## 2022-11-02 ENCOUNTER — Ambulatory Visit (INDEPENDENT_AMBULATORY_CARE_PROVIDER_SITE_OTHER): Payer: Commercial Managed Care - PPO | Admitting: Orthopedic Surgery

## 2022-11-02 ENCOUNTER — Encounter: Payer: Self-pay | Admitting: Orthopedic Surgery

## 2022-11-02 DIAGNOSIS — M1712 Unilateral primary osteoarthritis, left knee: Secondary | ICD-10-CM

## 2022-11-02 NOTE — Progress Notes (Signed)
Office Visit Note   Patient: Sally Hancock           Date of Birth: 1976-01-19           MRN: [id number] Visit Date: 11/02/2022              Requested by: Bonnita Nasuti, MD [address] [place],  [place] [address] PCP: Bonnita Nasuti, MD  Chief Complaint  Patient presents with   Left Knee - Pain      HPI: Patient is a 47 year old woman who is status post right total knee arthroplasty.  Patient states she has been having increasing pain in her left knee she states her knee feels unstable like she is walking on jelly.  She has used elevation ice tramadol Mobic and strengthening exercises.  Patient states she has had a hyaluronic acid injection that lasted for about 1 week.  Patient has also had multiple steroid injections.  Assessment & Plan: Visit Diagnoses:  1. Morbid obesity ([place])   2. Unilateral primary osteoarthritis, left knee     Plan: Anticipate patient will need to proceed with weight loss prior to be able to proceed with surgery.  We will pursue insurance authorization.  Follow-Up Instructions: No follow-ups on file.   Ortho Exam  Patient is alert, oriented, no adenopathy, well-dressed, normal affect, normal respiratory effort. Examination collaterals and cruciates are stable reviewing her outside radiographs shows bone-on-bone contact of the medial joint line she has crepitation with range of motion there is no cellulitis there is minimal effusion.  Height 5 foot 5 weight 382 pounds with a BMI of 63.  Blood pressure 141/88 with a pulse of 81.  Imaging: No results found. No images are attached to the encounter.  Labs: No results found for: "HGBA1C", "ESRSEDRATE", "CRP", "LABURIC", "REPTSTATUS", "GRAMSTAIN", "CULT", "LABORGA"   No results found for: "ALBUMIN", "PREALBUMIN", "CBC"  No results found for: "MG" No results found for: "VD25OH"  No results found for: "PREALBUMIN"    Latest Ref Rng & Units 12/10/2020    3:29 PM  CBC EXTENDED  WBC 4.0  - 10.5 K/uL 10.2   RBC 3.87 - 5.11 MIL/uL 4.94   Hemoglobin 12.0 - 15.0 g/dL 14.7   HCT 36.0 - 46.0 % 45.7   Platelets 150 - 400 K/uL 296      Body mass index is 63.57 kg/m.  Orders:  No orders of the defined types were placed in this encounter.  No orders of the defined types were placed in this encounter.    Procedures: No procedures performed  Clinical Data: No additional findings.  ROS:  All other systems negative, except as noted in the HPI. Review of Systems  Objective: Vital Signs: Ht 5\' 5"  (1.651 m)   Wt (!) 382 lb (173.3 kg)   BMI 63.57 kg/m   Specialty Comments:  No specialty comments available.  PMFS History: Patient Active Problem List   Diagnosis Date Noted   Arthritis of right knee 12/13/2020   Unilateral primary osteoarthritis, right knee    Past Medical History:  Diagnosis Date   Arthritis    Chronic bronchitis (HCC)    Fibromyalgia    GERD (gastroesophageal reflux disease)    Headache    migraines   History of hiatal hernia    Trauma    Vaginal Pap smear, abnormal     Family History  Problem Relation Age of Onset   Breast cancer Maternal Aunt     Past Surgical History:  Procedure Laterality Date   CESAREAN SECTION     CHOLECYSTECTOMY  2012   FRACTURE SURGERY Right 08/2015   acetabulum fx   HIP SURGERY     KNEE SURGERY     TONSILLECTOMY     @ 47 years old   TOTAL KNEE ARTHROPLASTY Right 12/13/2020   Procedure: RIGHT TOTAL KNEE ARTHROPLASTY;  Surgeon: Newt Minion, MD;  Location: [facility];  Service: Orthopedics;  Laterality: Right;   Social History   Occupational History   Not on file  Tobacco Use   Smoking status: Every Day    Packs/day: 0.50    Years: 5.00    Additional pack years: 0.00    Total pack years: 2.50    Types: Cigarettes   Smokeless tobacco: Never  Vaping Use   Vaping Use: Some days  Substance and Sexual Activity   Alcohol use: Never   Drug use: Never   Sexual activity: Not Currently    Birth  control/protection: I.U.D.

## 2023-04-06 ENCOUNTER — Other Ambulatory Visit (HOSPITAL_COMMUNITY): Payer: Self-pay

## 2023-08-13 ENCOUNTER — Ambulatory Visit (HOSPITAL_BASED_OUTPATIENT_CLINIC_OR_DEPARTMENT_OTHER): Payer: Commercial Managed Care - PPO

## 2024-03-30 DIAGNOSIS — E119 Type 2 diabetes mellitus without complications: Secondary | ICD-10-CM | POA: Diagnosis not present

## 2024-03-30 DIAGNOSIS — Z13220 Encounter for screening for lipoid disorders: Secondary | ICD-10-CM | POA: Diagnosis not present

## 2024-04-05 DIAGNOSIS — Z1331 Encounter for screening for depression: Secondary | ICD-10-CM | POA: Diagnosis not present

## 2024-04-05 DIAGNOSIS — E1169 Type 2 diabetes mellitus with other specified complication: Secondary | ICD-10-CM | POA: Diagnosis not present

## 2024-04-05 DIAGNOSIS — K760 Fatty (change of) liver, not elsewhere classified: Secondary | ICD-10-CM | POA: Diagnosis not present

## 2024-04-05 DIAGNOSIS — Z6841 Body Mass Index (BMI) 40.0 and over, adult: Secondary | ICD-10-CM | POA: Diagnosis not present

## 2024-04-07 DIAGNOSIS — Z760 Encounter for issue of repeat prescription: Secondary | ICD-10-CM | POA: Diagnosis not present

## 2024-04-07 DIAGNOSIS — M1990 Unspecified osteoarthritis, unspecified site: Secondary | ICD-10-CM | POA: Diagnosis not present

## 2024-04-07 DIAGNOSIS — Z6841 Body Mass Index (BMI) 40.0 and over, adult: Secondary | ICD-10-CM | POA: Diagnosis not present

## 2024-04-07 DIAGNOSIS — E119 Type 2 diabetes mellitus without complications: Secondary | ICD-10-CM | POA: Diagnosis not present
